# Patient Record
Sex: Male | Born: 1995
Health system: Southern US, Community
[De-identification: ages and names within clinical notes are randomized; demographics above are authoritative.]

## PROBLEM LIST (undated history)

## (undated) DIAGNOSIS — J302 Other seasonal allergic rhinitis: Secondary | ICD-10-CM

## (undated) DIAGNOSIS — J45909 Unspecified asthma, uncomplicated: Secondary | ICD-10-CM

## (undated) DIAGNOSIS — Q678 Other congenital deformities of chest: Secondary | ICD-10-CM

## (undated) DIAGNOSIS — Z8661 Personal history of infections of the central nervous system: Secondary | ICD-10-CM

## (undated) DIAGNOSIS — Z9109 Other allergy status, other than to drugs and biological substances: Secondary | ICD-10-CM

## (undated) DIAGNOSIS — G4733 Obstructive sleep apnea (adult) (pediatric): Secondary | ICD-10-CM

## (undated) HISTORY — DX: Other congenital deformities of chest: Q67.8

## (undated) HISTORY — DX: Personal history of infections of the central nervous system: Z86.61

## (undated) HISTORY — DX: Other seasonal allergic rhinitis: J30.2

## (undated) HISTORY — DX: Other allergy status, other than to drugs and biological substances: Z91.09

## (undated) HISTORY — DX: Obstructive sleep apnea (adult) (pediatric): G47.33

## (undated) HISTORY — DX: Unspecified asthma, uncomplicated: J45.909

---
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
## 2016-01-04 ENCOUNTER — Ambulatory Visit: Payer: Managed Care, Other (non HMO) | Admitting: Psychology

## 2016-04-25 ENCOUNTER — Encounter: Payer: Managed Care, Other (non HMO) | Admitting: Family Medicine

## 2016-06-23 ENCOUNTER — Encounter: Payer: Self-pay | Admitting: Family Medicine

## 2016-06-23 ENCOUNTER — Ambulatory Visit (INDEPENDENT_AMBULATORY_CARE_PROVIDER_SITE_OTHER): Payer: Managed Care, Other (non HMO) | Admitting: Family Medicine

## 2016-06-23 VITALS — BP 102/74 | HR 87 | Temp 98.3°F | Ht 70.0 in | Wt 132.5 lb

## 2016-06-23 DIAGNOSIS — J4520 Mild intermittent asthma, uncomplicated: Secondary | ICD-10-CM

## 2016-06-23 DIAGNOSIS — Z0000 Encounter for general adult medical examination without abnormal findings: Secondary | ICD-10-CM

## 2016-06-23 DIAGNOSIS — F411 Generalized anxiety disorder: Secondary | ICD-10-CM

## 2016-06-23 MED ORDER — BUSPIRONE HCL 5 MG PO TABS: ORAL_TABLET | ORAL | 11 refills | Status: DC

## 2016-06-23 MED ORDER — ALBUTEROL SULFATE HFA 108 (90 BASE) MCG/ACT IN AERS: 2.0000 | INHALATION_SPRAY | Freq: Four times a day (QID) | RESPIRATORY_TRACT | 5 refills | Status: DC | PRN

## 2016-06-23 NOTE — Assessment & Plan Note (Signed)
Controlled with PRN albuterol. Refilled.

## 2016-06-23 NOTE — Progress Notes (Addendum)
BP 102/74   Pulse 87   Temp 98.3 F (36.8 C) (Oral)   Ht 5\' 10"  (1.778 m)   Wt 132 lb 8 oz (60.1 kg)   SpO2 97%   BMI 19.01 kg/m    CC: CPE Subjective:    Patient ID: [name] [name], male    DOB: August 17, 1995, 21 y.o.   MRN: [id number]  HPI: [name] [name] is a 21 y.o. male presenting on 06/23/2016 for Annual Exam (Pt states he is doing well.) and Medication Refill (albuterol)   Anxiety - buspar 5/10mg  daily. Takes melatonin 1mg  at night time to help sleep - this helps prevent vivid dreams on buspar.   OSA, mild intermittent asthma, allergic rhinitis - sees Dr Craige Cotta. Off singulair. Asthma largely controlled.   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 As, 1 B this past year.   Preventative: Eye doctor 2 wks ago. Dentist 1 wk ago.  Brushes and floss.  Seat belt use discussed. Sunscreen use discussed. No changing moles on skin. Sees derm regularly - on accutane. Labs [place] with accutane (currently on month 4). I asked for copy if available.   Goes to gym at school.  Enjoys reading, video games.   Denies smoking, MJ or other recreational drugs. No alcohol.  Currently dating long term GF >2yr monogamous relationship. Discussed condom use and birth control.   No prior STD testing, declines today.   Relevant past medical, surgical, family and social history reviewed and updated as indicated. Interim medical history since our last visit reviewed. Allergies and medications reviewed and updated. Outpatient Medications Prior to Visit  Medication Sig Dispense Refill  . cetirizine (ZYRTEC) 10 MG tablet Take 10 mg by mouth daily as needed.     . Melatonin 1 MG CAPS Take 1 capsule (1 mg total) by mouth at bedtime.    Marland Kitchen albuterol (PROVENTIL HFA;VENTOLIN HFA) 108 (90 BASE) MCG/ACT inhaler Inhale 2 puffs into the lungs every 6 (six) hours as needed for wheezing. 1 Inhaler 5  . busPIRone (BUSPAR) 5 MG tablet Take one in morning and two at night  90 tablet 8  . fluticasone (FLONASE) 50 MCG/ACT nasal spray Place 2 sprays into both nostrils daily. 16 g 3  . Fluticasone-Salmeterol (ADVAIR) 100-50 MCG/DOSE AEPB Inhale 1 puff into the lungs 2 (two) times daily. 1 each 3  . montelukast (SINGULAIR) 10 MG tablet Take 1 tablet (10 mg total) by mouth at bedtime. 30 tablet 11   No facility-administered medications prior to visit.      Per HPI unless specifically indicated in ROS section below Review of Systems  Constitutional: Negative for activity change, appetite change, fatigue and fever.  HENT: Negative for congestion and sore throat.   Eyes: Negative for visual disturbance.  Respiratory: Negative for cough, chest tightness, shortness of breath and wheezing.   Cardiovascular: Negative for chest pain.  Gastrointestinal: Negative for abdominal pain.  Genitourinary: Negative for difficulty urinating.  Musculoskeletal: Negative for arthralgias.  Skin: Negative for rash.  Allergic/Immunologic: Negative for immunocompromised state.  Neurological: Negative for dizziness and headaches.  Hematological: Negative for adenopathy.  Psychiatric/Behavioral: Negative for dysphoric mood. The patient is not nervous/anxious.        Objective:    BP 102/74   Pulse 87   Temp 98.3 F (36.8 C) (Oral)   Ht 5\' 10"  (1.778 m)   Wt 132 lb 8 oz (60.1 kg)   SpO2 97%   BMI 19.01 kg/m  Wt Readings from Last 3 Encounters:  06/23/16 132 lb 8 oz (60.1 kg)  08/24/15 128 lb 8 oz (58.3 kg) (11 %, Z= -1.21)*  07/24/15 129 lb (58.5 kg) (12 %, Z= -1.16)*   * Growth percentiles are based on CDC 2-20 Years data.    Physical Exam  Constitutional: He is oriented to person, place, and time. He appears well-developed and well-nourished. No distress.  HENT:  Head: Normocephalic and atraumatic.  Right Ear: Hearing, tympanic membrane, external ear and ear canal normal.  Left Ear: Hearing, tympanic membrane, external ear and ear canal normal.  Nose: Nose normal.    Mouth/Throat: Uvula is midline, oropharynx is clear and moist and mucous membranes are normal. No oropharyngeal exudate, posterior oropharyngeal edema or posterior oropharyngeal erythema.  Eyes: Conjunctivae and EOM are normal. Pupils are equal, round, and reactive to light. No scleral icterus.  Neck: Normal range of motion. Neck supple. No thyromegaly present.  Cardiovascular: Normal rate, regular rhythm, normal heart sounds and intact distal pulses.   No murmur heard. Pulses:      Radial pulses are 2+ on the right side, and 2+ on the left side.  Pulmonary/Chest: Effort normal and breath sounds normal. No respiratory distress. He has no wheezes. He has no rales.  Abdominal: Soft. Bowel sounds are normal. He exhibits no distension and no mass. There is no tenderness. There is no rebound and no guarding.  Musculoskeletal: Normal range of motion. He exhibits no edema.  Lymphadenopathy:    He has no cervical adenopathy.  Neurological: He is alert and oriented to person, place, and time.  CN grossly intact, station and gait intact  Skin: Skin is warm and dry. No rash noted.  Psychiatric: He has a normal mood and affect. His behavior is normal. Judgment and thought content normal.  Nursing note and vitals reviewed.     Assessment & Plan:   Problem List Items Addressed This Visit    GAD (generalized anxiety disorder)    Stable period on buspar 5mg /10mg  daily. Melatonin 1mg  helps prevent vivid dreams as side effect of buspar.       Health maintenance examination - Primary    Preventative protocols reviewed and updated unless pt declined. Discussed healthy diet and lifestyle.       Mild intermittent asthma    Controlled with PRN albuterol. Refilled.       Relevant Medications   albuterol (PROVENTIL HFA;VENTOLIN HFA) 108 (90 Base) MCG/ACT inhaler       Follow up plan: Return in about 1 year (around 06/23/2017) for annual exam, prior fasting for blood work.  Eustaquio BoydenJavier Makenley Shimp, MD

## 2016-06-23 NOTE — Patient Instructions (Signed)
You are doing well today. Return as needed or in 1 year for next physical.  Health Maintenance, Male A healthy lifestyle and preventive care is important for your health and wellness. Ask your health care provider about what schedule of regular examinations is right for you. What should I know about weight and diet? Eat a Healthy Diet  Eat plenty of vegetables, fruits, whole grains, low-fat dairy products, and lean protein.  Do not eat a lot of foods high in solid fats, added sugars, or salt.  Maintain a Healthy Weight Regular exercise can help you achieve or maintain a healthy weight. You should:  Do at least 150 minutes of exercise each week. The exercise should increase your heart rate and make you sweat (moderate-intensity exercise).  Do strength-training exercises at least twice a week.  Watch Your Levels of Cholesterol and Blood Lipids  Have your blood tested for lipids and cholesterol every 5 years starting at 20 years of age. If you are at high risk for heart disease, you should start having your blood tested when you are 20 years old. You may need to have your cholesterol levels checked more often if: ? Your lipid or cholesterol levels are high. ? You are older than 20 years of age. ? You are at high risk for heart disease.  What should I know about cancer screening? Many types of cancers can be detected early and may often be prevented. Lung Cancer  You should be screened every year for lung cancer if: ? You are a current smoker who has smoked for at least 30 years. ? You are a former smoker who has quit within the past 15 years.  Talk to your health care provider about your screening options, when you should start screening, and how often you should be screened.  Colorectal Cancer  Routine colorectal cancer screening usually begins at 20 years of age and should be repeated every 5-10 years until you are 20 years old. You may need to be screened more often if early  forms of precancerous polyps or small growths are found. Your health care provider may recommend screening at an earlier age if you have risk factors for colon cancer.  Your health care provider may recommend using home test kits to check for hidden blood in the stool.  A small camera at the end of a tube can be used to examine your colon (sigmoidoscopy or colonoscopy). This checks for the earliest forms of colorectal cancer.  Prostate and Testicular Cancer  Depending on your age and overall health, your health care provider may do certain tests to screen for prostate and testicular cancer.  Talk to your health care provider about any symptoms or concerns you have about testicular or prostate cancer.  Skin Cancer  Check your skin from head to toe regularly.  Tell your health care provider about any new moles or changes in moles, especially if: ? There is a change in a mole's size, shape, or color. ? You have a mole that is larger than a pencil eraser.  Always use sunscreen. Apply sunscreen liberally and repeat throughout the day.  Protect yourself by wearing long sleeves, pants, a wide-brimmed hat, and sunglasses when outside.  What should I know about heart disease, diabetes, and high blood pressure?  If you are 18-39 years of age, have your blood pressure checked every 3-5 years. If you are 40 years of age or older, have your blood pressure checked every year. You should have   your blood pressure measured twice-once when you are at a hospital or clinic, and once when you are not at a hospital or clinic. Record the average of the two measurements. To check your blood pressure when you are not at a hospital or clinic, you can use: ? An automated blood pressure machine at a pharmacy. ? A home blood pressure monitor.  Talk to your health care provider about your target blood pressure.  If you are between 45-79 years old, ask your health care provider if you should take aspirin to prevent  heart disease.  Have regular diabetes screenings by checking your fasting blood sugar level. ? If you are at a normal weight and have a low risk for diabetes, have this test once every three years after the age of 45. ? If you are overweight and have a high risk for diabetes, consider being tested at a younger age or more often.  A one-time screening for abdominal aortic aneurysm (AAA) by ultrasound is recommended for men aged 65-75 years who are current or former smokers. What should I know about preventing infection? Hepatitis B If you have a higher risk for hepatitis B, you should be screened for this virus. Talk with your health care provider to find out if you are at risk for hepatitis B infection. Hepatitis C Blood testing is recommended for:  Everyone born from 1945 through 1965.  Anyone with known risk factors for hepatitis C.  Sexually Transmitted Diseases (STDs)  You should be screened each year for STDs including gonorrhea and chlamydia if: ? You are sexually active and are younger than 20 years of age. ? You are older than 20 years of age and your health care provider tells you that you are at risk for this type of infection. ? Your sexual activity has changed since you were last screened and you are at an increased risk for chlamydia or gonorrhea. Ask your health care provider if you are at risk.  Talk with your health care provider about whether you are at high risk of being infected with HIV. Your health care provider may recommend a prescription medicine to help prevent HIV infection.  What else can I do?  Schedule regular health, dental, and eye exams.  Stay current with your vaccines (immunizations).  Do not use any tobacco products, such as cigarettes, chewing tobacco, and e-cigarettes. If you need help quitting, ask your health care provider.  Limit alcohol intake to no more than 2 drinks per day. One drink equals 12 ounces of beer, 5 ounces of wine, or 1 ounces  of hard liquor.  Do not use street drugs.  Do not share needles.  Ask your health care provider for help if you need support or information about quitting drugs.  Tell your health care provider if you often feel depressed.  Tell your health care provider if you have ever been abused or do not feel safe at home. This information is not intended to replace advice given to you by your health care provider. Make sure you discuss any questions you have with your health care provider. Document Released: 06/28/2007 Document Revised: 08/29/2015 Document Reviewed: 10/03/2014 Elsevier Interactive Patient Education  2018 Elsevier Inc.  

## 2016-06-23 NOTE — Assessment & Plan Note (Signed)
Preventative protocols reviewed and updated unless pt declined. Discussed healthy diet and lifestyle.  

## 2016-06-23 NOTE — Assessment & Plan Note (Signed)
Stable period on buspar 5mg /10mg  daily. Melatonin 1mg  helps prevent vivid dreams as side effect of buspar.

## 2016-07-06 ENCOUNTER — Encounter: Payer: Self-pay | Admitting: Family Medicine

## 2016-07-06 DIAGNOSIS — L709 Acne, unspecified: Secondary | ICD-10-CM | POA: Insufficient documentation

## 2016-10-03 ENCOUNTER — Other Ambulatory Visit: Payer: Self-pay | Admitting: Family Medicine

## 2016-10-03 ENCOUNTER — Other Ambulatory Visit: Payer: Self-pay | Admitting: *Deleted

## 2016-10-03 MED ORDER — BUSPIRONE HCL 5 MG PO TABS: ORAL_TABLET | ORAL | 2 refills | Status: DC

## 2016-10-03 MED ORDER — BUSPIRONE HCL 5 MG PO TABS: ORAL_TABLET | ORAL | 11 refills | Status: DC

## 2016-10-03 NOTE — Telephone Encounter (Signed)
[facility] closed in [place]. [facility] Rx sent [facility]

## 2016-10-03 NOTE — Addendum Note (Signed)
Addended by: Desmond Dike on: 10/03/2016 10:45 AM   Modules accepted: Orders

## 2016-10-03 NOTE — Telephone Encounter (Signed)
Mother contacted office back and [facility] out of med. She has contacted [employer]. Sent as req

## 2017-06-26 ENCOUNTER — Ambulatory Visit: Payer: 59 | Admitting: Family Medicine

## 2017-06-26 ENCOUNTER — Encounter: Payer: Self-pay | Admitting: Family Medicine

## 2017-06-26 VITALS — BP 116/80 | HR 80 | Temp 98.3°F | Ht 70.0 in | Wt 140.2 lb

## 2017-06-26 DIAGNOSIS — F411 Generalized anxiety disorder: Secondary | ICD-10-CM | POA: Diagnosis not present

## 2017-06-26 MED ORDER — CITALOPRAM HYDROBROMIDE 10 MG PO TABS: 10.0000 mg | ORAL_TABLET | Freq: Every day | ORAL | 6 refills | Status: DC

## 2017-06-26 NOTE — Addendum Note (Signed)
Addended by: Eustaquio BoydenGUTIERREZ, Marwa Fuhrman on: 06/26/2017 04:24 PM   Modules accepted: Orders

## 2017-06-26 NOTE — Patient Instructions (Addendum)
Let's taper off of buspar - 5mg  at night for 2 weeks then stop.  Start celexa 10mg  daily - take 1/2 tablet for the first week to ensure tolerated well.  Continue healthy stress relieving strategies.  Update me after 1 month through MyChart, return in 2 months for follow up.

## 2017-06-26 NOTE — Assessment & Plan Note (Addendum)
Increasing trouble tolerating buspar, not helping manage his anxiety, now noticing depressive symptoms related to trouble anxiety causes. Will taper off buspar over 2 wks, start celexa 10mg  daily (first week take 1/2 tab daily). Reviewed common side effects of medication including slight increased suicidality risk. Update via MyChart with effect in 1 month, RTC 2 mo f/u visit.  Continue healthy stress relieving strategies.

## 2017-06-26 NOTE — Progress Notes (Signed)
BP 116/80 (BP Location: Left Arm, Patient Position: Sitting, Cuff Size: Normal)   Pulse 80   Temp 98.3 F (36.8 C) (Oral)   Ht 5\' 10"  (1.778 m)   Wt 140 lb 4 oz (63.6 kg)   SpO2 98%   BMI 20.12 kg/m    CC: discuss anxiety Subjective:    Patient ID: [name] [name], male    DOB: 16-Jul-1995, 22 y.o.   MRN: [id number]  HPI: [name] [name] is a 22 y.o. male presenting on 06/26/2017 for Anxiety   GAD with anxiety attacks - has been taking buspar 5/10mg  daily for a few years. 10mg  bid dose caused dizziness. Current dose was initially effective, but recently noticing more trouble - dizziness to 5mg  dose even, avoids driving when dizzy (needs to drive to classes). Melatonin was helping prevent vivid dreams as side effect to buspar, but melatonin is causing more grogginess in the mornings - so he's stopped this as well. Previously saw counselor - was helpful but not interested in returning at this time.  Started going to gym. Deep breathing and mantra help him manage anxiety.   Noticing an increase in depressed mood with apathy - related to anxiety. No anhedonia. No HI. Some fleeting SI without plan. Appetite ok. Sleep trouble due to buspar. Energy levels ok.   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  Wants to go to [employer] grad school to study PhD in [employer] when he finishes college.   Alcohol - none.   Relevant past medical, surgical, family and social history reviewed and updated as indicated. Interim medical history since our last visit reviewed. Allergies and medications reviewed and updated. Outpatient Medications Prior to Visit  Medication Sig Dispense Refill  . albuterol (PROVENTIL HFA;VENTOLIN HFA) 108 (90 Base) MCG/ACT inhaler Inhale 2 puffs into the lungs every 6 (six) hours as needed for wheezing. 1 Inhaler 5  . busPIRone (BUSPAR) 5 MG tablet TAKE 1 TABLET BY MOUTH IN THE MORNING AND TAKE 2 AT BEDTIME 90 tablet 3  . cetirizine (ZYRTEC) 10 MG  tablet Take 10 mg by mouth daily as needed.     . ISOtretinoin (ACCUTANE) 40 MG capsule Take 40 mg by mouth 2 (two) times daily.    . Melatonin 1 MG CAPS Take 1 capsule (1 mg total) by mouth at bedtime.     No facility-administered medications prior to visit.      Per HPI unless specifically indicated in ROS section below Review of Systems     Objective:    BP 116/80 (BP Location: Left Arm, Patient Position: Sitting, Cuff Size: Normal)   Pulse 80   Temp 98.3 F (36.8 C) (Oral)   Ht 5\' 10"  (1.778 m)   Wt 140 lb 4 oz (63.6 kg)   SpO2 98%   BMI 20.12 kg/m   Wt Readings from Last 3 Encounters:  06/26/17 140 lb 4 oz (63.6 kg)  06/23/16 132 lb 8 oz (60.1 kg)  08/24/15 128 lb 8 oz (58.3 kg) (11 %, Z= -1.21)*   * Growth percentiles are based on CDC (Boys, 2-20 Years) data.    Physical Exam  Constitutional: He appears well-developed and well-nourished. No distress.  Psychiatric: His speech is normal and behavior is normal. Judgment and thought content normal. His mood appears anxious (mild).  Nursing note and vitals reviewed.  Depression screen PHQ 2/9 06/26/2017  Decreased Interest 2  Down, Depressed, Hopeless 2  PHQ - 2 Score 4  Altered sleeping  3  Tired, decreased energy 3  Change in appetite 0  Feeling bad or failure about yourself  1  Trouble concentrating 2  Moving slowly or fidgety/restless 0  Suicidal thoughts 1  PHQ-9 Score 14    GAD 7 : Generalized Anxiety Score 06/26/2017 06/26/2015  Nervous, Anxious, on Edge 3 3  Control/stop worrying 2 3  Worry too much - different things 2 2  Trouble relaxing 2 3  Restless 3 1  Easily annoyed or irritable 0 2  Afraid - awful might happen 0 3  Total GAD 7 Score 12 17      Assessment & Plan:   Problem List Items Addressed This Visit    GAD (generalized anxiety disorder) - Primary    Increasing trouble tolerating buspar, not helping manage his anxiety, now noticing depressive symptoms related to trouble anxiety causes.  Will taper off buspar over 2 wks, start celexa 10mg  daily (first week take 1/2 tab daily). Reviewed common side effects of medication including slight increased suicidality risk. Update via MyChart with effect in 1 month, RTC 2 mo f/u visit.  Continue healthy stress relieving strategies.      Relevant Medications   citalopram (CELEXA) 10 MG tablet       Meds ordered this encounter  Medications  . citalopram (CELEXA) 10 MG tablet    Sig: Take 1 tablet (10 mg total) by mouth daily.    Dispense:  30 tablet    Refill:  6   No orders of the defined types were placed in this encounter.   Follow up plan: Return if symptoms worsen or fail to improve.  Eustaquio BoydenJavier Chrisotpher Rivero, MD

## 2017-07-28 IMAGING — DX DG LUMBAR SPINE COMPLETE 4+V
5 series · 5 of 5 positions shown · non-contrast
Comparison: Lumbar spine series of August 23, 2013

CLINICAL DATA: Low back pain for several months; history of mild
scoliosis

EXAM:
LUMBAR SPINE - COMPLETE 4+ VIEW

[l-spine ap]
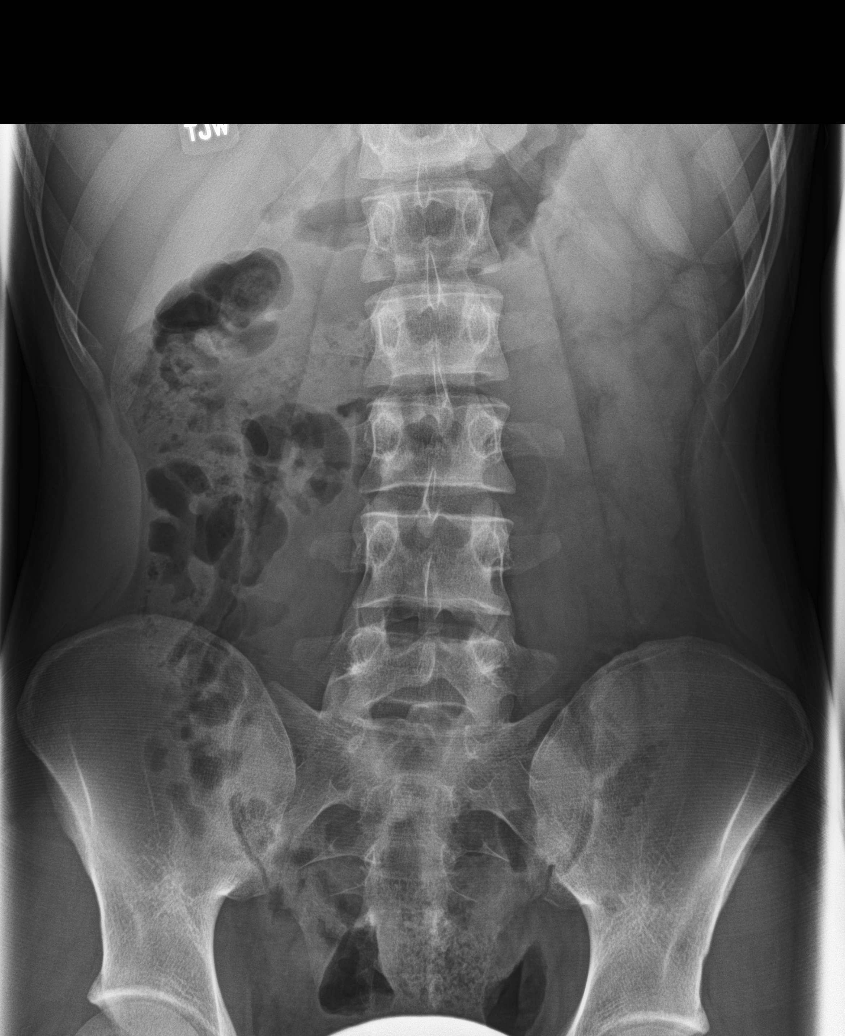

[l-spine obl (1 of 2)]
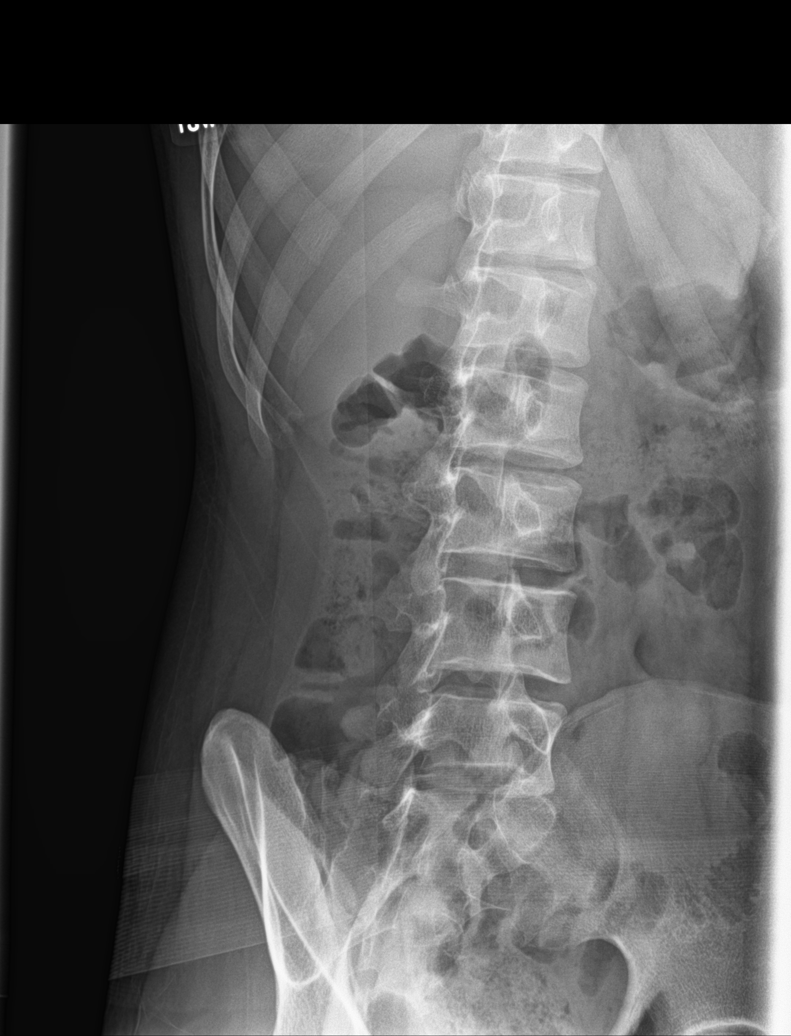

[l-spine obl (2 of 2)]
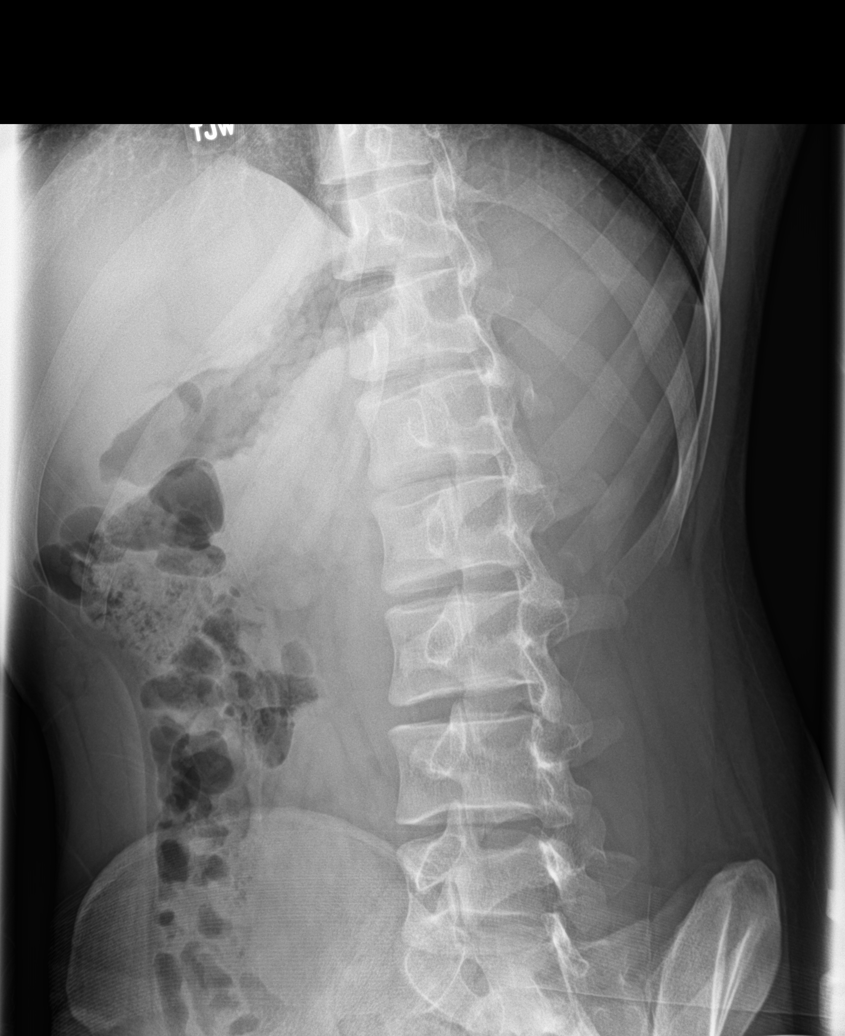

[l-spine lat]
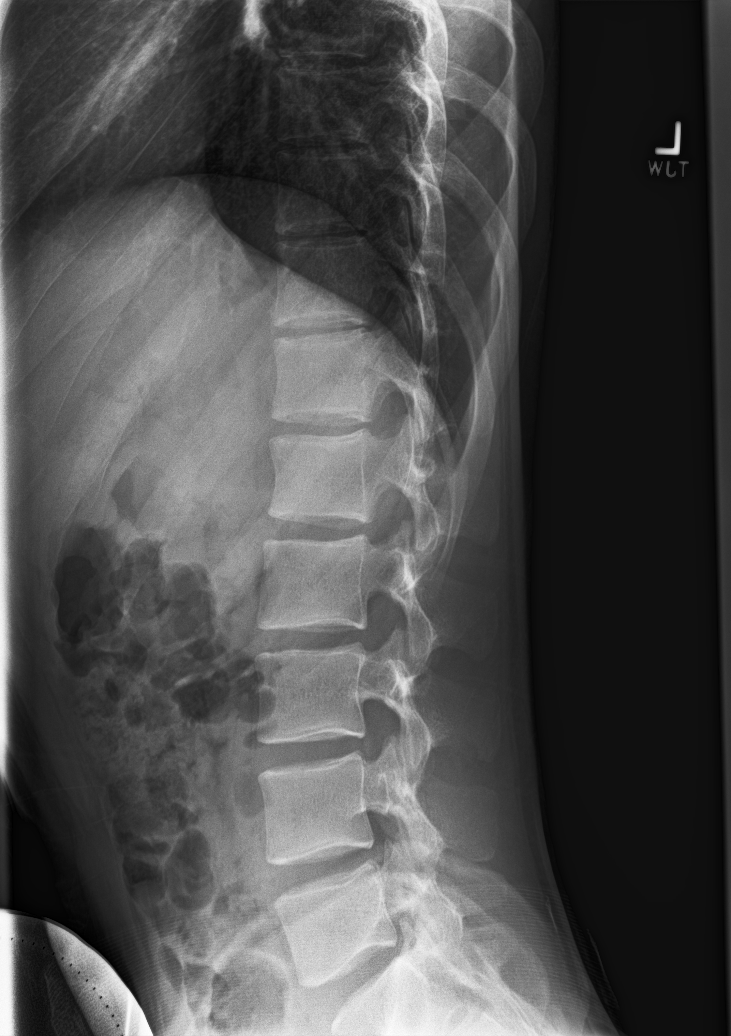

[l-spine l5/s1]
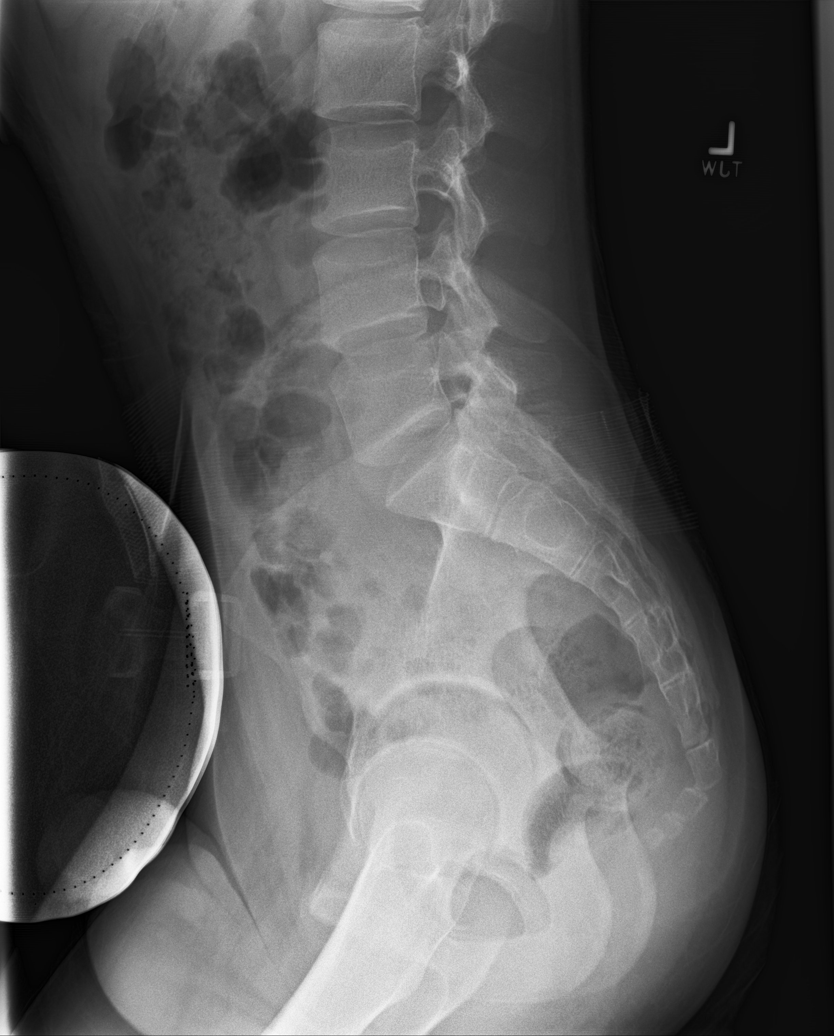

[5 of 5 positions shown; findings below may reference images not displayed]

FINDINGS: There is mild levocurvature of the lumbar spine centered at L3 which
is more conspicuous than in the past. The pedicles and transverse
processes are intact. There are no pars defects. The vertebral
bodies are preserved in height. The disc space heights are well
maintained. The observed portions of the sacrum are normal.
IMPRESSION: There is no acute bony abnormality nor significant disc space
narrowing. There is mild levocurvature centered at L3 which is more
conspicuous than that seen on the previous study.

## 2017-08-03 ENCOUNTER — Encounter: Payer: Self-pay | Admitting: Family Medicine

## 2018-02-26 ENCOUNTER — Other Ambulatory Visit: Payer: Self-pay | Admitting: Family Medicine

## 2018-02-26 NOTE — Telephone Encounter (Signed)
Please call patient and schedule follow-up on medication. Send back for refill when after appointment is scheduled.

## 2018-02-26 NOTE — Telephone Encounter (Signed)
Appointment 3/6 pt aware

## 2018-03-08 ENCOUNTER — Other Ambulatory Visit: Payer: Self-pay | Admitting: Family Medicine

## 2018-03-09 ENCOUNTER — Encounter: Payer: Self-pay | Admitting: Family Medicine

## 2018-03-10 MED ORDER — CITALOPRAM HYDROBROMIDE 10 MG PO TABS: ORAL_TABLET | ORAL | 3 refills | Status: DC

## 2018-03-19 ENCOUNTER — Ambulatory Visit: Payer: Self-pay | Admitting: Family Medicine

## 2018-08-20 ENCOUNTER — Encounter: Payer: Self-pay | Admitting: Family Medicine

## 2018-08-20 ENCOUNTER — Ambulatory Visit (INDEPENDENT_AMBULATORY_CARE_PROVIDER_SITE_OTHER): Payer: 59 | Admitting: Family Medicine

## 2018-08-20 ENCOUNTER — Other Ambulatory Visit: Payer: Self-pay

## 2018-08-20 DIAGNOSIS — F411 Generalized anxiety disorder: Secondary | ICD-10-CM | POA: Diagnosis not present

## 2018-08-20 MED ORDER — CITALOPRAM HYDROBROMIDE 20 MG PO TABS: 20.0000 mg | ORAL_TABLET | Freq: Every day | ORAL | 3 refills | Status: DC

## 2018-08-20 NOTE — Patient Instructions (Signed)
You are doing well today Trial celexa (citalopram) 20mg  daily. If side effects on higher dose then cut in half and let me know for next refill to return to 10mg  dose.

## 2018-08-20 NOTE — Assessment & Plan Note (Signed)
Overall doing well but interested in higher celexa dose - will send in 20mg  dose to trial. He will decrease to 10mg  dose if side effects develop. Pt agrees with plan.

## 2018-08-20 NOTE — Progress Notes (Signed)
This visit was conducted in person.  BP 112/70 (BP Location: Left Arm, Patient Position: Sitting, Cuff Size: Normal)   Pulse 74   Temp 98.2 F (36.8 C) (Temporal)   Ht 5\' 10"  (1.778 m)   Wt 153 lb 7 oz (69.6 kg)   SpO2 98%   BMI 22.02 kg/m    CC: anxiety f/u visit Subjective:    Patient ID: [name] [name], male    DOB: [birth year], 23 y.o.   MRN: [id number]  HPI: [name] [name] is a 23 y.o. male presenting on 08/20/2018 for Anxiety (Here for f/u.)   GAD - last year we cross tapered off buspar onto celexa 10mg  daily due to increased buspar dizzy side fx. Has previously seen counselor. Fleeting SI - distracts himself.   [employer] last year. Planning to start [employer] grad school for [employer].   Has been in [place] for last few months.       Relevant past medical, surgical, family and social history reviewed and updated as indicated. Interim medical history since our last visit reviewed. Allergies and medications reviewed and updated. Outpatient Medications Prior to Visit  Medication Sig Dispense Refill  . albuterol (PROVENTIL HFA;VENTOLIN HFA) 108 (90 Base) MCG/ACT inhaler Inhale 2 puffs into the lungs every 6 (six) hours as needed for wheezing. 1 Inhaler 5  . cetirizine (ZYRTEC) 10 MG tablet Take 10 mg by mouth daily as needed.     . citalopram (CELEXA) 10 MG tablet TAKE 1 TABLET(10 MG) BY MOUTH DAILY 30 tablet 3   No facility-administered medications prior to visit.      Per HPI unless specifically indicated in ROS section below Review of Systems Objective:    BP 112/70 (BP Location: Left Arm, Patient Position: Sitting, Cuff Size: Normal)   Pulse 74   Temp 98.2 F (36.8 C) (Temporal)   Ht 5\' 10"  (1.778 m)   Wt 153 lb 7 oz (69.6 kg)   SpO2 98%   BMI 22.02 kg/m   Wt Readings from Last 3 Encounters:  08/20/18 153 lb 7 oz (69.6 kg)  06/26/17 140 lb 4 oz (63.6 kg)  06/23/16 132 lb 8 oz (60.1 kg)    Physical Exam Vitals signs and nursing note reviewed.   Constitutional:      General: He is not in acute distress.    Appearance: Normal appearance. He is not ill-appearing.  HENT:     Mouth/Throat:     Mouth: Mucous membranes are moist.  Cardiovascular:     Rate and Rhythm: Normal rate and regular rhythm.     Pulses: Normal pulses.     Heart sounds: Normal heart sounds. No murmur.  Pulmonary:     Effort: Pulmonary effort is normal. No respiratory distress.     Breath sounds: Normal breath sounds. No wheezing, rhonchi or rales.  Neurological:     Mental Status: He is alert.  Psychiatric:        Mood and Affect: Mood normal.        Behavior: Behavior normal.        GAD 7 : Generalized Anxiety Score 08/20/2018 06/26/2017 06/26/2015  Nervous, Anxious, on Edge 2 3 3   Control/stop worrying 1 2 3   Worry too much - different things 1 2 2   Trouble relaxing 0 2 3  Restless 0 3 1  Easily annoyed or irritable 2 0 2  Afraid - awful might happen 1 0 3  Total GAD 7 Score 7 12 17    Depression  screen [facility] 2/9 08/20/2018 06/26/2017  Decreased Interest 1 2  Down, Depressed, Hopeless 1 2  PHQ - 2 Score 2 4  Altered sleeping 3 3  Tired, decreased energy 1 3  Change in appetite 0 0  Feeling bad or failure about yourself  0 1  Trouble concentrating 2 2  Moving slowly or fidgety/restless 0 0  Suicidal thoughts 1 1  PHQ-9 Score 9 14   Assessment & Plan:   Problem List Items Addressed This Visit    GAD (generalized anxiety disorder)    Overall doing well but interested in higher celexa dose - will send in 20mg  dose to trial. He will decrease to 10mg  dose if side effects develop. Pt agrees with plan.       Relevant Medications   citalopram (CELEXA) 20 MG tablet       Meds ordered this encounter  Medications  . citalopram (CELEXA) 20 MG tablet    Sig: Take 1 tablet (20 mg total) by mouth daily.    Dispense:  90 tablet    Refill:  3   No orders of the defined types were placed in this encounter.   Follow up plan: Return in about 1 year  (around 08/20/2019) for annual exam, prior fasting for blood work.  [name] BoydenJavier Genesis Novosad, MD

## 2018-10-28 ENCOUNTER — Ambulatory Visit (INDEPENDENT_AMBULATORY_CARE_PROVIDER_SITE_OTHER): Payer: 59

## 2018-10-28 DIAGNOSIS — Z23 Encounter for immunization: Secondary | ICD-10-CM | POA: Diagnosis not present

## 2019-02-08 ENCOUNTER — Encounter: Payer: Self-pay | Admitting: Family Medicine

## 2019-02-08 DIAGNOSIS — F411 Generalized anxiety disorder: Secondary | ICD-10-CM

## 2019-02-09 ENCOUNTER — Ambulatory Visit: Payer: 59 | Admitting: Family Medicine

## 2019-02-21 ENCOUNTER — Ambulatory Visit: Payer: 59 | Admitting: Psychology

## 2019-02-21 ENCOUNTER — Ambulatory Visit (INDEPENDENT_AMBULATORY_CARE_PROVIDER_SITE_OTHER): Payer: 59 | Admitting: Psychology

## 2019-02-21 DIAGNOSIS — F411 Generalized anxiety disorder: Secondary | ICD-10-CM

## 2019-02-21 DIAGNOSIS — F330 Major depressive disorder, recurrent, mild: Secondary | ICD-10-CM

## 2019-03-07 ENCOUNTER — Ambulatory Visit (INDEPENDENT_AMBULATORY_CARE_PROVIDER_SITE_OTHER): Payer: 59 | Admitting: Psychology

## 2019-03-07 DIAGNOSIS — F411 Generalized anxiety disorder: Secondary | ICD-10-CM

## 2019-03-07 DIAGNOSIS — F330 Major depressive disorder, recurrent, mild: Secondary | ICD-10-CM

## 2019-03-19 ENCOUNTER — Ambulatory Visit: Payer: 59 | Attending: Internal Medicine

## 2019-03-19 DIAGNOSIS — Z23 Encounter for immunization: Secondary | ICD-10-CM | POA: Insufficient documentation

## 2019-03-19 NOTE — Progress Notes (Signed)
   Covid-19 Vaccination Clinic  Name:  [name] [name]    MRN: [id number] DOB: August 26, 1995  03/19/2019  [name] [name] was observed post Covid-19 immunization for 15 minutes without incident. He was provided with Vaccine Information Sheet and instruction to access the V-Safe system.   [name] [name] was instructed to call 911 with any severe reactions post vaccine: Marland Kitchen Difficulty breathing  . Swelling of face and throat  . A fast heartbeat  . A bad rash all over body  . Dizziness and weakness   Immunizations Administered    Name Date Dose VIS Date Route   Pfizer COVID-19 Vaccine 03/19/2019 11:47 AM 0.3 mL 12/24/2018 Intramuscular   Manufacturer: [employer], [employer]   Lot: [id number]   NDC: [address]-6

## 2019-03-22 ENCOUNTER — Ambulatory Visit (INDEPENDENT_AMBULATORY_CARE_PROVIDER_SITE_OTHER): Payer: 59 | Admitting: Psychology

## 2019-03-22 DIAGNOSIS — F411 Generalized anxiety disorder: Secondary | ICD-10-CM | POA: Diagnosis not present

## 2019-03-22 DIAGNOSIS — F330 Major depressive disorder, recurrent, mild: Secondary | ICD-10-CM

## 2019-03-30 ENCOUNTER — Ambulatory Visit (INDEPENDENT_AMBULATORY_CARE_PROVIDER_SITE_OTHER): Payer: 59 | Admitting: Psychology

## 2019-03-30 DIAGNOSIS — F411 Generalized anxiety disorder: Secondary | ICD-10-CM

## 2019-03-30 DIAGNOSIS — F330 Major depressive disorder, recurrent, mild: Secondary | ICD-10-CM | POA: Diagnosis not present

## 2019-04-09 ENCOUNTER — Ambulatory Visit: Payer: 59 | Attending: Internal Medicine

## 2019-04-09 DIAGNOSIS — Z23 Encounter for immunization: Secondary | ICD-10-CM

## 2019-04-09 NOTE — Progress Notes (Signed)
   Covid-19 Vaccination Clinic  Name:  [name] [name]    MRN: [id number] DOB: [birth year]  04/09/2019  Mr. [name] [name] was observed post Covid-19 immunization for 15 minutes without incident. He was provided with Vaccine Information Sheet and instruction to access the V-Safe system.   Mr. [name] [name] was instructed to call 911 with any severe reactions post vaccine: Marland Kitchen Difficulty breathing  . Swelling of face and throat  . A fast heartbeat  . A bad rash all over body  . Dizziness and weakness   Immunizations Administered    Name Date Dose VIS Date Route   Pfizer COVID-19 Vaccine 04/09/2019  9:04 AM 0.3 mL 12/24/2018 Intramuscular   Manufacturer: [employer], [employer]   Lot: [id number]   NDC: [address]-6

## 2019-04-12 ENCOUNTER — Ambulatory Visit (INDEPENDENT_AMBULATORY_CARE_PROVIDER_SITE_OTHER): Payer: 59 | Admitting: Psychology

## 2019-04-12 DIAGNOSIS — F331 Major depressive disorder, recurrent, moderate: Secondary | ICD-10-CM

## 2019-04-12 DIAGNOSIS — F411 Generalized anxiety disorder: Secondary | ICD-10-CM | POA: Diagnosis not present

## 2019-04-16 ENCOUNTER — Encounter: Payer: Self-pay | Admitting: Family Medicine

## 2019-04-18 NOTE — Telephone Encounter (Addendum)
Spoke with pt scheduling GAD f/u OV on 04/20/19 at 9:15.  Denies any life threatening effects of citalopram.

## 2019-04-19 ENCOUNTER — Ambulatory Visit: Payer: Self-pay

## 2019-04-20 ENCOUNTER — Encounter: Payer: Self-pay | Admitting: Family Medicine

## 2019-04-20 ENCOUNTER — Other Ambulatory Visit: Payer: Self-pay

## 2019-04-20 ENCOUNTER — Ambulatory Visit: Payer: 59 | Admitting: Family Medicine

## 2019-04-20 DIAGNOSIS — F411 Generalized anxiety disorder: Secondary | ICD-10-CM | POA: Diagnosis not present

## 2019-04-20 MED ORDER — ALBUTEROL SULFATE HFA 108 (90 BASE) MCG/ACT IN AERS: 2.0000 | INHALATION_SPRAY | Freq: Four times a day (QID) | RESPIRATORY_TRACT | 3 refills | Status: DC | PRN

## 2019-04-20 MED ORDER — FLUOXETINE HCL 40 MG PO CAPS: 40.0000 mg | ORAL_CAPSULE | Freq: Every day | ORAL | 6 refills | Status: DC

## 2019-04-20 NOTE — Assessment & Plan Note (Signed)
Anxiety > depression with some OCD tendencies.  Finds celexa 20mg  causing delayed ejaculation.  Discussed wellbutrin is only antidepressant without sexual side effects, however may not be best choice with anxiety or for OCD tendencies.  Will trial prozac 40mg  in place of celexa. Update with effect, may communicate through mychart.  F/u open ended, RTC 3 mo if not doing well or sooner as needed.

## 2019-04-20 NOTE — Progress Notes (Signed)
This visit was conducted in person.  BP 116/80 (BP Location: Left Arm, Patient Position: Sitting, Cuff Size: Normal)   Pulse 86   Temp 97.7 F (36.5 C) (Temporal)   Ht 5\' 10"  (1.778 m)   Wt 160 lb 9 oz (72.8 kg)   SpO2 98%   BMI 23.04 kg/m    CC: mood f/u Subjective:    Patient ID: , male    DOB: December 28, 1995, 24 y.o.   MRN: [id number]  HPI: [name] [name] is a 24 y.o. male presenting on 04/20/2019 for Anxiety (Here for f/u and discuss meds. )   See prior note for details.  GAD - 2019 we cross tapered off buspar onto celexa 20mg  daily due to buspar side effect of dizziness. Restarted counseling last month - with [facility]. Notes anxiety >> depression. Exploring OCD tendencies with counselor - this may be a manifestation of his anxiety. 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 Was on celexa 20mg  daily. Cannot afford psychiatrist. Notes delayed ejaculation with celexa.   Going to grad school for [employer].  Took leave of absence - doesn't like virtual school. Planning to go back in person Fall 2021. Looking into jobs using undergrad degree [employer] and [employer]). 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   Some ongoing SI - distracts himself - grounding exercises. Finds therapy has helped with this. No concern for acting out on these thoughts     Relevant past medical, surgical, family and social history reviewed and updated as indicated. Interim medical history since our last visit reviewed. Allergies and medications reviewed and updated. Outpatient Medications Prior to Visit  Medication Sig Dispense Refill  . cetirizine (ZYRTEC) 10 MG tablet Take 10 mg by mouth daily as needed.     [employer] albuterol (PROVENTIL HFA;VENTOLIN HFA) 108 (90 Base) MCG/ACT inhaler Inhale 2 puffs into the lungs every 6 (six) hours as needed for  wheezing. 1 Inhaler 5  . citalopram (CELEXA) 20 MG tablet Take 1 tablet (20 mg total) by mouth daily. 90 tablet 3   No facility-administered medications prior to visit.     Per HPI unless specifically indicated in ROS section below Review of Systems Objective:    BP 116/80 (BP Location: Left Arm, Patient Position: Sitting, Cuff Size: Normal)   Pulse 86   Temp 97.7 F (36.5 C) (Temporal)   Ht 5\' 10"  (1.778 m)   Wt 160 lb 9 oz (72.8 kg)   SpO2 98%   BMI 23.04 kg/m   Wt Readings from Last 3 Encounters:  04/20/19 160 lb 9 oz (72.8 kg)  08/20/18 153 lb 7 oz (69.6 kg)  06/26/17 140 lb 4 oz (63.6 kg)    Physical Exam Vitals and nursing note reviewed.  Constitutional:      Appearance: Normal appearance. He is not ill-appearing.  Neck:     Thyroid: No thyromegaly or thyroid tenderness.  Cardiovascular:     Rate and Rhythm: Normal rate and regular rhythm.     Pulses: Normal pulses.     Heart sounds: Normal heart sounds. No murmur.  Pulmonary:     Effort: Pulmonary effort is normal. No respiratory distress.     Breath sounds: Normal breath sounds. No wheezing, rhonchi or rales.  Neurological:     Mental Status: He is alert.  Psychiatric:  Mood and Affect: Mood normal.        Behavior: Behavior normal.       Depression screen [facility] 2/9 04/20/2019 08/20/2018 06/26/2017  Decreased Interest 1 1 2   Down, Depressed, Hopeless 1 1 2   PHQ - 2 Score 2 2 4   Altered sleeping 3 3 3   Tired, decreased energy 3 1 3   Change in appetite 0 0 0  Feeling bad or failure about yourself  2 0 1  Trouble concentrating 2 2 2   Moving slowly or fidgety/restless 0 0 0  Suicidal thoughts 2 1 1   PHQ-9 Score 14 9 14     GAD 7 : Generalized Anxiety Score 04/20/2019 08/20/2018 06/26/2017 06/26/2015  Nervous, Anxious, on Edge 3 2 3 3   Control/stop worrying 2 1 2 3   Worry too much - different things 2 1 2 2   Trouble relaxing 2 0 2 3  Restless 1 0 3 1  Easily annoyed or irritable 1 2 0 2  Afraid - awful  might happen 3 1 0 3  Total GAD 7 Score 14 7 12 17    Assessment & Plan:  This visit occurred during the SARS-CoV-2 public health emergency.  Safety protocols were in place, including screening questions prior to the visit, additional usage of staff PPE, and extensive cleaning of exam room while observing appropriate contact time as indicated for disinfecting solutions.   Problem List Items Addressed This Visit    GAD (generalized anxiety disorder)    Anxiety > depression with some OCD tendencies.  Finds celexa 20mg  causing delayed ejaculation.  Discussed wellbutrin is only antidepressant without sexual side effects, however may not be best choice with anxiety or for OCD tendencies.  Will trial prozac 40mg  in place of celexa. Update with effect, may communicate through mychart.  F/u open ended, RTC 3 mo if not doing well or sooner as needed.       Relevant Medications   FLUoxetine (PROZAC) 40 MG capsule       Meds ordered this encounter  Medications  . albuterol (VENTOLIN HFA) 108 (90 Base) MCG/ACT inhaler    Sig: Inhale 2 puffs into the lungs every 6 (six) hours as needed for wheezing.    Dispense:  18 g    Refill:  3  . FLUoxetine (PROZAC) 40 MG capsule    Sig: Take 1 capsule (40 mg total) by mouth daily.    Dispense:  30 capsule    Refill:  6   No orders of the defined types were placed in this encounter.   Patient Instructions  Let's switch from celexa to prozac 40mg  daily - may stop celexa one day and start prozac the next. Update me over mychart with how you're doing, let us know if ongoing trouble with sexual dysfunction to trial different antidepressant.    Follow up plan: Return if symptoms worsen or fail to improve.  Ria Bush, MD

## 2019-04-20 NOTE — Patient Instructions (Signed)
Let's switch from celexa to prozac 40mg  daily - may stop celexa one day and start prozac the next. Update me over mychart with how you're doing, let know if ongoing trouble with sexual dysfunction to trial different antidepressant.

## 2019-05-04 ENCOUNTER — Ambulatory Visit (INDEPENDENT_AMBULATORY_CARE_PROVIDER_SITE_OTHER): Payer: 59 | Admitting: Psychology

## 2019-05-04 DIAGNOSIS — F331 Major depressive disorder, recurrent, moderate: Secondary | ICD-10-CM

## 2019-05-04 DIAGNOSIS — F411 Generalized anxiety disorder: Secondary | ICD-10-CM

## 2019-05-06 ENCOUNTER — Encounter: Payer: Self-pay | Admitting: Family Medicine

## 2019-05-06 DIAGNOSIS — F411 Generalized anxiety disorder: Secondary | ICD-10-CM

## 2019-05-08 MED ORDER — SERTRALINE HCL 50 MG PO TABS: 50.0000 mg | ORAL_TABLET | Freq: Every day | ORAL | 3 refills | Status: DC

## 2019-05-31 ENCOUNTER — Ambulatory Visit: Payer: 59 | Admitting: Psychology

## 2019-08-24 ENCOUNTER — Encounter: Payer: 59 | Admitting: Family Medicine

## 2019-10-07 ENCOUNTER — Other Ambulatory Visit: Payer: Self-pay

## 2019-10-07 ENCOUNTER — Encounter: Payer: Self-pay | Admitting: Family Medicine

## 2019-10-07 ENCOUNTER — Ambulatory Visit (INDEPENDENT_AMBULATORY_CARE_PROVIDER_SITE_OTHER): Payer: BC Managed Care – PPO | Admitting: Family Medicine

## 2019-10-07 VITALS — BP 116/82 | HR 61 | Temp 98.2°F | Ht 70.0 in | Wt 160.1 lb

## 2019-10-07 DIAGNOSIS — Z0000 Encounter for general adult medical examination without abnormal findings: Secondary | ICD-10-CM | POA: Diagnosis not present

## 2019-10-07 DIAGNOSIS — Z23 Encounter for immunization: Secondary | ICD-10-CM

## 2019-10-07 DIAGNOSIS — Z113 Encounter for screening for infections with a predominantly sexual mode of transmission: Secondary | ICD-10-CM | POA: Diagnosis not present

## 2019-10-07 DIAGNOSIS — R451 Restlessness and agitation: Secondary | ICD-10-CM

## 2019-10-07 DIAGNOSIS — Z13220 Encounter for screening for lipoid disorders: Secondary | ICD-10-CM

## 2019-10-07 DIAGNOSIS — G4733 Obstructive sleep apnea (adult) (pediatric): Secondary | ICD-10-CM

## 2019-10-07 DIAGNOSIS — F411 Generalized anxiety disorder: Secondary | ICD-10-CM

## 2019-10-07 MED ORDER — BUPROPION HCL ER (SR) 100 MG PO TB12: 100.0000 mg | ORAL_TABLET | Freq: Every day | ORAL | 3 refills | Status: DC

## 2019-10-07 NOTE — Patient Instructions (Addendum)
Flu shot today  Tdap today.  Labs today  Look into online counseling.  Let's try wellbutrin 100mg  SR once daily for mood, attention, energy.  Update me through mychart with how you're doing. Return as needed or in 1 year for next physical.   Health Maintenance, Male Adopting a healthy lifestyle and getting preventive care are important in promoting health and wellness. Ask your health care provider about:  The right schedule for you to have regular tests and exams.  Things you can do on your own to prevent diseases and keep yourself healthy. What should I know about diet, weight, and exercise? Eat a healthy diet   Eat a diet that includes plenty of vegetables, fruits, low-fat dairy products, and lean protein.  Do not eat a lot of foods that are high in solid fats, added sugars, or sodium. Maintain a healthy weight Body mass index (BMI) is a measurement that can be used to identify possible weight problems. It estimates body fat based on height and weight. Your health care provider can help determine your BMI and help you achieve or maintain a healthy weight. Get regular exercise Get regular exercise. This is one of the most important things you can do for your health. Most adults should:  Exercise for at least 150 minutes each week. The exercise should increase your heart rate and make you sweat (moderate-intensity exercise).  Do strengthening exercises at least twice a week. This is in addition to the moderate-intensity exercise.  Spend less time sitting. Even light physical activity can be beneficial. Watch cholesterol and blood lipids Have your blood tested for lipids and cholesterol at 23 years of age, then have this test every 5 years. You may need to have your cholesterol levels checked more often if:  Your lipid or cholesterol levels are high.  You are older than 23 years of age.  You are at high risk for heart disease. What should I know about cancer screening? Many  types of cancers can be detected early and may often be prevented. Depending on your health history and family history, you may need to have cancer screening at various ages. This may include screening for:  Colorectal cancer.  Prostate cancer.  Skin cancer.  Lung cancer. What should I know about heart disease, diabetes, and high blood pressure? Blood pressure and heart disease  High blood pressure causes heart disease and increases the risk of stroke. This is more likely to develop in people who have high blood pressure readings, are of African descent, or are overweight.  Talk with your health care provider about your target blood pressure readings.  Have your blood pressure checked: ? Every 3-5 years if you are 66-26 years of age. ? Every year if you are 79 years old or older.  If you are between the ages of 34 and 2 and are a current or former smoker, ask your health care provider if you should have a one-time screening for abdominal aortic aneurysm (AAA). Diabetes Have regular diabetes screenings. This checks your fasting blood sugar level. Have the screening done:  Once every three years after age 70 if you are at a normal weight and have a low risk for diabetes.  More often and at a younger age if you are overweight or have a high risk for diabetes. What should I know about preventing infection? Hepatitis B If you have a higher risk for hepatitis B, you should be screened for this virus. Talk with your health care  provider to find out if you are at risk for hepatitis B infection. Hepatitis C Blood testing is recommended for:  Everyone born from [birth year] through 1964.  Anyone with known risk factors for hepatitis C. Sexually transmitted infections (STIs)  You should be screened each year for STIs, including gonorrhea and chlamydia, if: ? You are sexually active and are younger than 23 years of age. ? You are older than 23 years of age and your health care provider tells you  that you are at risk for this type of infection. ? Your sexual activity has changed since you were last screened, and you are at increased risk for chlamydia or gonorrhea. Ask your health care provider if you are at risk.  Ask your health care provider about whether you are at high risk for HIV. Your health care provider may recommend a prescription medicine to help prevent HIV infection. If you choose to take medicine to prevent HIV, you should first get tested for HIV. You should then be tested every 3 months for as long as you are taking the medicine. Follow these instructions at home: Lifestyle  Do not use any products that contain nicotine or tobacco, such as cigarettes, e-cigarettes, and chewing tobacco. If you need help quitting, ask your health care provider.  Do not use street drugs.  Do not share needles.  Ask your health care provider for help if you need support or information about quitting drugs. Alcohol use  Do not drink alcohol if your health care provider tells you not to drink.  If you drink alcohol: ? Limit how much you have to 0-2 drinks a day. ? Be aware of how much alcohol is in your drink. In the [place] one drink equals one 12 oz bottle of beer (355 mL), one 5 oz glass of wine (148 mL), or one 1 oz glass of hard liquor (44 mL). General instructions  Schedule regular health, dental, and eye exams.  Stay current with your vaccines.  Tell your health care provider if: ? You often feel depressed. ? You have ever been abused or do not feel safe at home. Summary  Adopting a healthy lifestyle and getting preventive care are important in promoting health and wellness.  Follow your health care provider's instructions about healthy diet, exercising, and getting tested or screened for diseases.  Follow your health care provider's instructions on monitoring your cholesterol and blood pressure. This information is not intended to replace advice given to you by your  health care provider. Make sure you discuss any questions you have with your health care provider. Document Revised: 12/23/2017 Document Reviewed: 12/23/2017 Elsevier Patient Education  2020 [employer].

## 2019-10-07 NOTE — Assessment & Plan Note (Signed)
Preventative protocols reviewed and updated unless pt declined. Discussed healthy diet and lifestyle.  

## 2019-10-07 NOTE — Progress Notes (Signed)
This visit was conducted in person.  BP 116/82 (BP Location: Left Arm, Patient Position: Sitting, Cuff Size: Normal)   Pulse 61   Temp 98.2 F (36.8 C) (Temporal)   Ht 5\' 10"  (1.778 m)   Wt 160 lb 1 oz (72.6 kg)   SpO2 98%   BMI 22.97 kg/m    CC: CPE Subjective:    Patient ID: [name] [name], male    DOB: May 02, 1995, 24 y.o.   MRN: [id number]  HPI: [name] [name] is a 24 y.o. male presenting on 10/07/2019 for Annual Exam   GAD/depression - was on celexa 20mg  daily, last visit we transitioned to prozac 40mg  daily but caused ongoing side effects so we transitioned to zoloft 50mg  daily - some apathy with this. Dizziness on buspar. Avoiding wellbutrin due to anxiety/OCD tendencies. He self tapered off all antidepressants and overall feels stable but notes ongoing difficulty managing stress. Some mental fogginess. Anxiety > depression. Doesn't think could afford psychiatry. No SI or self harm - grounding exercises help. Has seen counselor - currently not seeing anyone but may look into online counseling. Finds inner restlessness - has bouncing ball he uses at home. Overall attention is good. + fmhx ADD.  [facility]07/2019 04/20/2019 08/20/2018 06/26/2017  Decreased Interest 0 1 1 2   Down, Depressed, Hopeless 0 1 1 2   PHQ - 2 Score 0 2 2 4   Altered sleeping 2 3 3 3   Tired, decreased energy 2 3 1 3   Change in appetite 1 0 0 0  Feeling bad or failure about yourself  2 2 0 1  Trouble concentrating 0 2 2 2   Moving slowly or fidgety/restless 1 0 0 0  Suicidal thoughts 2 2 1 1   PHQ-9 Score 10 14 9 14     GAD 7 : Generalized Anxiety Score 10/07/2019 04/20/2019 08/20/2018 06/26/2017  Nervous, Anxious, on Edge 2 3 2 3   Control/stop worrying 1 2 1 2   Worry too much - different things 2 2 1 2   Trouble relaxing 2 2 0 2  Restless 1 1 0 3  Easily annoyed or irritable 1 1 2  0  Afraid - awful might happen 2 3 1  0  Total GAD 7 Score 11 14 7 12     Pt endorses persistent pattern of  inattention and/or hyperactivity/impulsivity that can interfere with daily functioning. Symptoms present in 2 or more settings. Symptoms present before 23 years of age? unsure Inattention (6+, 6 months): Careless mistakes? no Difficulty sustaining attention? yes Doesn't listen when spoken to directly? no Lacks follow through with instructions or difficulty completing tasks? yes Difficulty organizing tasks/activities? yes Avoiding tasks that require sustained attention? some Easily losing things needed for tasks? no Easily distracted by external stimuli? yes Forgetful in daily activities? yes Hyperactive/impulsive (6+, 6 months): Fidgeting, tapping hands/feet? yes Leaves seat when expected to remain in seat? yes Feeling restless, or running around when not expected? yes Unable to engage in leisurely activities quietly? yes Always on the go, "driven by a motor"? yes Excessive talking? some Blurting out answer, responds to other's questions? no Difficulty waiting in line or waiting turn? no Interrupting or intruding on others? no    Preventative: Flu shot yearly COVID vaccine - Pfizer 03/2019 x2 Tdap 2009, again today Non smoker Alcohol - 1 drink a few times a week rec drugs - none Currently sexually active with 1 partner - monogamous with GF. Partner uses OCP Seat belt use discussed. Sunscreen use discussed. No changing moles  on skin.  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 Interim medical history since our last visit reviewed. Allergies and medications reviewed and updated. Outpatient Medications Prior to Visit  Medication Sig Dispense Refill  . albuterol (VENTOLIN HFA) 108 (90 Base) MCG/ACT inhaler Inhale 2 puffs into the lungs every 6 (six) hours as needed  for wheezing. 18 g 3  . cetirizine (ZYRTEC) 10 MG tablet Take 10 mg by mouth daily as needed.     [employer] FLUoxetine (PROZAC) 40 MG capsule Take 1 capsule (40 mg total) by mouth daily. (Patient not taking: Reported on 10/07/2019) 30 capsule 6  . sertraline (ZOLOFT) 50 MG tablet Take 1 tablet (50 mg total) by mouth daily. (Patient not taking: Reported on 10/07/2019) 30 tablet 3   No facility-administered medications prior to visit.     Per HPI unless specifically indicated in ROS section below Review of Systems  Constitutional: Negative for activity change, appetite change, chills, fatigue, fever and unexpected weight change.  HENT: Negative for hearing loss.   Eyes: Negative for visual disturbance.  Respiratory: Negative for cough, chest tightness, shortness of breath and wheezing.   Cardiovascular: Negative for chest pain, palpitations and leg swelling.  Gastrointestinal: Negative for abdominal distention, abdominal pain, blood in stool, constipation, diarrhea, nausea and vomiting.  Genitourinary: Negative for difficulty urinating and hematuria.  Musculoskeletal: Negative for arthralgias, myalgias and neck pain.  Skin: Negative for rash.  Neurological: Negative for dizziness, seizures, syncope and headaches.  Hematological: Negative for adenopathy. Does not bruise/bleed easily.  Psychiatric/Behavioral: Negative for behavioral problems, dysphoric mood and suicidal ideas. The patient is nervous/anxious.    Objective:  BP 116/82 (BP Location: Left Arm, Patient Position: Sitting, Cuff Size: Normal)   Pulse 61   Temp 98.2 F (36.8 C) (Temporal)   Ht 5\' 10"  (1.778 m)   Wt 160 lb 1 oz (72.6 kg)   SpO2 98%   BMI 22.97 kg/m   Wt Readings from Last 3 Encounters:  10/07/19 160 lb 1 oz (72.6 kg)  04/20/19 160 lb 9 oz (72.8 kg)  08/20/18 153 lb 7 oz (69.6 kg)      Physical Exam Vitals and nursing note reviewed.  Constitutional:      General: He is not in acute distress.    Appearance: Normal  appearance. He is well-developed. He is not ill-appearing.  HENT:     Head: Normocephalic and atraumatic.     Right Ear: Hearing, tympanic membrane, ear canal and external ear normal.     Left Ear: Hearing, tympanic membrane, ear canal and external ear normal.  Eyes:     General: No scleral icterus.    Extraocular Movements: Extraocular movements intact.     Conjunctiva/sclera: Conjunctivae normal.     Pupils: Pupils are equal, round, and reactive to light.  Neck:     Thyroid: No thyroid mass or thyromegaly.  Cardiovascular:     Rate and Rhythm: Normal rate and regular rhythm.     Pulses: Normal pulses.          Radial pulses are 2+ on the right side and 2+ on the left side.     Heart sounds: Normal heart sounds. No murmur heard.   Pulmonary:     Effort: Pulmonary effort is normal. No respiratory distress.  Breath sounds: Normal breath sounds. No wheezing, rhonchi or rales.  Abdominal:     General: Abdomen is flat. Bowel sounds are normal. There is no distension.     Palpations: Abdomen is soft. There is no mass.     Tenderness: There is no abdominal tenderness. There is no guarding or rebound.     Hernia: No hernia is present.  Musculoskeletal:        General: Normal range of motion.     Cervical back: Normal range of motion and neck supple.     Right lower leg: No edema.     Left lower leg: No edema.  Lymphadenopathy:     Cervical: No cervical adenopathy.  Skin:    General: Skin is warm and dry.     Findings: No rash.  Neurological:     General: No focal deficit present.     Mental Status: He is alert and oriented to person, place, and time.     Comments: CN grossly intact, station and gait intact  Psychiatric:        Mood and Affect: Mood normal.        Behavior: Behavior normal.        Thought Content: Thought content normal.        Judgment: Judgment normal.       Results for orders placed or performed in visit on 10/07/19  Comprehensive metabolic panel    Result Value Ref Range   Glucose, Bld 86 65 - 99 mg/dL   BUN 10 7 - 25 mg/dL   Creat 1.96 2.22 - 9.79 mg/dL   BUN/Creatinine Ratio NOT APPLICABLE 6 - 22 (calc)   Sodium 140 135 - 146 mmol/L   Potassium 4.2 3.5 - 5.3 mmol/L   Chloride 104 98 - 110 mmol/L   CO2 28 20 - 32 mmol/L   Calcium 9.8 8.6 - 10.3 mg/dL   Total Protein 7.0 6.1 - 8.1 g/dL   Albumin 4.6 3.6 - 5.1 g/dL   Globulin 2.4 1.9 - 3.7 g/dL (calc)   AG Ratio 1.9 1.0 - 2.5 (calc)   Total Bilirubin 2.1 (H) 0.2 - 1.2 mg/dL   Alkaline phosphatase (APISO) 56 36 - 130 U/L   AST 14 10 - 40 U/L   ALT 12 9 - 46 U/L  TSH  Result Value Ref Range   TSH 1.75 0.40 - 4.50 mIU/L  CBC with Differential/Platelet  Result Value Ref Range   WBC 7.5 3.8 - 10.8 Thousand/uL   RBC 5.00 4.20 - 5.80 Million/uL   Hemoglobin 16.0 13.2 - 17.1 g/dL   HCT 89.2 38 - 50 %   MCV 93.0 80.0 - 100.0 fL   MCH 32.0 27.0 - 33.0 pg   MCHC 34.4 32.0 - 36.0 g/dL   RDW 11.9 41.7 - 40.8 %   Platelets 317 140 - 400 Thousand/uL   MPV 9.5 7.5 - 12.5 fL   Neutro Abs 4,568 1,500 - 7,800 cells/uL   Lymphs Abs 2,175 850 - 3,900 cells/uL   Absolute Monocytes 510 200 - 950 cells/uL   Eosinophils Absolute 218 15 - 500 cells/uL   Basophils Absolute 30 0 - 200 cells/uL   Neutrophils Relative % 60.9 %   Total Lymphocyte 29.0 %   Monocytes Relative 6.8 %   Eosinophils Relative 2.9 %   Basophils Relative 0.4 %  Lipid panel  Result Value Ref Range   Cholesterol 120 <200 mg/dL   HDL 53 > OR = 40 mg/dL  Triglycerides 49 <150 mg/dL   LDL Cholesterol (Calc) 53 mg/dL (calc)   Total CHOL/HDL Ratio 2.3 <5.0 (calc)   Non-HDL Cholesterol (Calc) 67 <638 mg/dL (calc)    Assessment & Plan:  This visit occurred during the SARS-CoV-2 public health emergency.  Safety protocols were in place, including screening questions prior to the visit, additional usage of staff PPE, and extensive cleaning of exam room while observing appropriate contact time as indicated for disinfecting  solutions.   Problem List Items Addressed This Visit    Restlessness    Some intermittent restlessness throughout his life, explored OCD tendencies with prior counselor. Today describes inattention as well, borderline positive screen for adult ADHD. Discussed this possibility. Interested in trial of wellbutrin to help focus - will start 100mg  SR once daily dosing, take in am. No h/o seizures. Update with effect. Discussed option of formal ADHD testing through psychology.       Obstructive sleep apnea   Relevant Orders   Comprehensive metabolic panel (Completed)   TSH (Completed)   CBC with Differential/Platelet (Completed)   Health maintenance examination - Primary    Preventative protocols reviewed and updated unless pt declined. Discussed healthy diet and lifestyle.       GAD (generalized anxiety disorder)    Longstanding h/o this, hasn't responded fully to SSRI trials. Self tapered off antidepressant this year. Planning to see . See below.       Relevant Medications   buPROPion (WELLBUTRIN SR) 100 MG 12 hr tablet    Other Visit Diagnoses    Need for influenza vaccination       Relevant Orders   Flu Vaccine QUAD 36+ mos IM (Completed)   Screen for STD (sexually transmitted disease)       Relevant Orders   HIV Antibody (routine testing w rflx)   RPR   Lipid screening       Relevant Orders   Lipid panel (Completed)   Need for Tdap vaccination       Relevant Orders   Tdap vaccine greater than or equal to 7yo IM (Completed)       Meds ordered this encounter  Medications  . buPROPion (WELLBUTRIN SR) 100 MG 12 hr tablet    Sig: Take 1 tablet (100 mg total) by mouth daily.    Dispense:  30 tablet    Refill:  3   Orders Placed This Encounter  Procedures  . Flu Vaccine QUAD 36+ mos IM  . Tdap vaccine greater than or equal to 7yo IM  . Comprehensive metabolic panel  . TSH  . CBC with Differential/Platelet  . HIV Antibody (routine testing w rflx)  .  Lipid panel  . RPR    Patient instructions: Flu shot today  Tdap today.  Labs today  Look into online counseling.  Let's try wellbutrin 100mg  SR once daily for mood, attention, energy.  Update me through mychart with how you're doing. Return as needed or in 1 year for next physical.   Follow up plan: Return in about 1 year (around 10/06/2020), or if symptoms worsen or fail to improve, for annual exam, prior fasting for blood work.  , MD

## 2019-10-08 ENCOUNTER — Encounter: Payer: Self-pay | Admitting: Family Medicine

## 2019-10-08 DIAGNOSIS — R451 Restlessness and agitation: Secondary | ICD-10-CM | POA: Insufficient documentation

## 2019-10-08 NOTE — Assessment & Plan Note (Signed)
Some intermittent restlessness throughout his life, explored OCD tendencies with prior counselor. Today describes inattention as well, borderline positive screen for adult ADHD. Discussed this possibility. Interested in trial of wellbutrin to help focus - will start 100mg  SR once daily dosing, take in am. No h/o seizures. Update with effect. Discussed option of formal ADHD testing through psychology.

## 2019-10-08 NOTE — Assessment & Plan Note (Signed)
Longstanding h/o this, hasn't responded fully to SSRI trials. Self tapered off antidepressant this year. Planning to see Personnel officer. See below.

## 2019-10-10 LAB — CBC WITH DIFFERENTIAL/PLATELET
Absolute Monocytes: 510 cells/uL (ref 200–950)
Basophils Absolute: 30 cells/uL (ref 0–200)
Basophils Relative: 0.4 %
Eosinophils Absolute: 218 cells/uL (ref 15–500)
Eosinophils Relative: 2.9 %
HCT: 46.5 % (ref 38.5–50.0)
Hemoglobin: 16 g/dL (ref 13.2–17.1)
Lymphs Abs: 2175 cells/uL (ref 850–3900)
MCH: 32 pg (ref 27.0–33.0)
MCHC: 34.4 g/dL (ref 32.0–36.0)
MCV: 93 fL (ref 80.0–100.0)
MPV: 9.5 fL (ref 7.5–12.5)
Monocytes Relative: 6.8 %
Neutro Abs: 4568 cells/uL (ref 1500–7800)
Neutrophils Relative %: 60.9 %
Platelets: 317 10*3/uL (ref 140–400)
RBC: 5 10*6/uL (ref 4.20–5.80)
RDW: 12.7 % (ref 11.0–15.0)
Total Lymphocyte: 29 %
WBC: 7.5 10*3/uL (ref 3.8–10.8)

## 2019-10-10 LAB — LIPID PANEL
Cholesterol: 120 mg/dL (ref <200)
HDL: 53 mg/dL (ref >=40)
LDL Cholesterol (Calc): 53 mg/dL (calc)
Non-HDL Cholesterol (Calc): 67 mg/dL (calc) (ref <130)
Total CHOL/HDL Ratio: 2.3 (calc) (ref <5.0)
Triglycerides: 49 mg/dL (ref <150)

## 2019-10-10 LAB — COMPREHENSIVE METABOLIC PANEL
AG Ratio: 1.9 (calc) (ref 1.0–2.5)
ALT: 12 U/L (ref 9–46)
AST: 14 U/L (ref 10–40)
Albumin: 4.6 g/dL (ref 3.6–5.1)
Alkaline phosphatase (APISO): 56 U/L (ref 36–130)
BUN: 10 mg/dL (ref 7–25)
CO2: 28 mmol/L (ref 20–32)
Calcium: 9.8 mg/dL (ref 8.6–10.3)
Chloride: 104 mmol/L (ref 98–110)
Creat: 0.76 mg/dL (ref 0.60–1.35)
Globulin: 2.4 g/dL (calc) (ref 1.9–3.7)
Glucose, Bld: 86 mg/dL (ref 65–99)
Potassium: 4.2 mmol/L (ref 3.5–5.3)
Sodium: 140 mmol/L (ref 135–146)
Total Bilirubin: 2.1 mg/dL — ABNORMAL HIGH (ref 0.2–1.2)
Total Protein: 7 g/dL (ref 6.1–8.1)

## 2019-10-10 LAB — RPR: RPR Ser Ql: NONREACTIVE

## 2019-10-10 LAB — HIV ANTIBODY (ROUTINE TESTING W REFLEX): HIV 1&2 Ab, 4th Generation: NONREACTIVE

## 2019-10-10 LAB — TSH: TSH: 1.75 mIU/L (ref 0.40–4.50)

## 2019-10-18 ENCOUNTER — Encounter: Payer: Self-pay | Admitting: Family Medicine

## 2019-10-18 DIAGNOSIS — R451 Restlessness and agitation: Secondary | ICD-10-CM

## 2019-10-18 DIAGNOSIS — F411 Generalized anxiety disorder: Secondary | ICD-10-CM

## 2019-10-18 DIAGNOSIS — Z1339 Encounter for screening examination for other mental health and behavioral disorders: Secondary | ICD-10-CM

## 2019-10-24 NOTE — Addendum Note (Signed)
Addended by: Eustaquio Boyden on: 10/24/2019 08:42 PM   Modules accepted: Orders

## 2019-10-27 NOTE — Telephone Encounter (Deleted)
I do not see a referral in Epic.

## 2020-02-07 ENCOUNTER — Encounter: Payer: Self-pay | Admitting: Family Medicine

## 2020-02-07 ENCOUNTER — Telehealth: Payer: BC Managed Care – PPO | Admitting: Family Medicine

## 2020-02-07 DIAGNOSIS — F39 Unspecified mood [affective] disorder: Secondary | ICD-10-CM | POA: Insufficient documentation

## 2020-02-07 DIAGNOSIS — F322 Major depressive disorder, single episode, severe without psychotic features: Secondary | ICD-10-CM

## 2020-02-07 DIAGNOSIS — F5105 Insomnia due to other mental disorder: Secondary | ICD-10-CM

## 2020-02-07 DIAGNOSIS — F411 Generalized anxiety disorder: Secondary | ICD-10-CM | POA: Diagnosis not present

## 2020-02-07 DIAGNOSIS — F419 Anxiety disorder, unspecified: Secondary | ICD-10-CM

## 2020-02-07 DIAGNOSIS — G4700 Insomnia, unspecified: Secondary | ICD-10-CM | POA: Insufficient documentation

## 2020-02-07 MED ORDER — TRAZODONE HCL 50 MG PO TABS: 50.0000 mg | ORAL_TABLET | Freq: Every day | ORAL | 1 refills | Status: DC

## 2020-02-07 NOTE — Progress Notes (Signed)
Mr. [name] [name], [name] [name] are scheduled for a virtual visit with your provider today.    Just as we do with appointments in the office, we must obtain your consent to participate.  Your consent will be active for this visit and any virtual visit you may have with one of our providers in the next 365 days.    If you have a MyChart account, I can also send a copy of this consent to you electronically.  All virtual visits are billed to your insurance company just like a traditional visit in the office.  As this is a virtual visit, video technology does not allow for your provider to perform a traditional examination.  This may limit your provider's ability to fully assess your condition.  If your provider identifies any concerns that need to be evaluated in person or the need to arrange testing such as labs, EKG, etc, we will make arrangements to do so.    Although advances in technology are sophisticated, we cannot ensure that it will always work on either your end or our end.  If the connection with a video visit is poor, we may have to switch to a telephone visit.  With either a video or telephone visit, we are not always able to ensure that we have a secure connection.   I need to obtain your verbal consent now.   Are you willing to proceed with your visit today?   [name] [name] has provided verbal consent on 02/07/2020 for a virtual visit (video or telephone).   [name] Finner, NP 02/07/2020  11:13 AM   Date:  02/07/2020   ID:  [name] [name], DOB 01-25-1995, MRN [id number]  Patient Location: Home Provider Location: Home Office   Participants: Patient and Provider for Visit and Wrap up  Method of visit: Video  Location of Patient: Home Location of Provider: Home Office Consent was obtain for visit over the video. Services rendered by provider: Visit was performed via video  A video enabled telemedicine application was used and I verified that I am speaking with the correct person using two  identifiers.  PCP:  [name] Boyden, MD   Chief Complaint:  Anxiety, depression and can not sleep  History of Present Illness:    [name] [name] is a 25 y.o. male with history as stated below. Presents video telehealth for an acute care visit due to increased insomnia, depression and anxiety.  Has a history of having these in the past.  Has been on several medications including Celexa, Prozac, Zoloft, BuSpar, Wellbutrin.  He does not do very well with some of these as they have the side effects and he has been transitioned and tapered off and on them for some time.  He denies having any active SI or self-harm at this time.  However he does report that over the weekend it came to ahead and he started having thoughts of wanting to harm himself which is what prompted him to seek visit.  He has practiced grounding exercises in the past.  Is seeing a counselor on a at but not actively being seen by regularly.  He is looking to get diagnosis of ADHD to see if there is some overlap there with some of his symptoms.  However his testing cannot be done till May.  He feels he is very restless at times.  Feels like a bouncing ball at times.  Is not able to sleep at this time.  Previously did not think that he can afford psychiatry  however in today's conversation he is willing to revisit the idea secondary to a new job.  The patient does not have symptoms concerning for COVID-19 infection (fever, chills, cough, or new shortness of breath).   Past Medical, Surgical, Social History, Allergies, and Medications have been Reviewed.  Past Medical History:  Diagnosis Date  . Asthma   . Deformity, chest wall, congenital   . Environmental allergies    mold,pine trees  . History of meningitis infancy  . OSA (obstructive sleep apnea)   . Seasonal allergies     Current Meds  Medication Sig  . traZODone (DESYREL) 50 MG tablet Take 1 tablet (50 mg total) by mouth at bedtime.     Allergies:   Peanut-containing  drug products and Wellbutrin [bupropion]   ROS See HPI for history of present illness.  Physical Exam Constitutional:      Appearance: Normal appearance.  HENT:     Head: Normocephalic.     Nose: Nose normal.  Eyes:     Conjunctiva/sclera: Conjunctivae normal.  Pulmonary:     Breath sounds: Normal breath sounds.  Neurological:     Mental Status: He is alert and oriented to person, place, and time.  Psychiatric:        Attention and Perception: Attention and perception normal.        Mood and Affect: Mood is depressed.        Behavior: Behavior is cooperative.     Comments: Good eye contact and communication               Depression screen [facility] [address] 02/07/2020 10/07/2019 04/20/2019 08/20/2018 06/26/2017  Decreased Interest 3 0 1 1 2   Down, Depressed, Hopeless 3 0 1 1 2   PHQ - 2 Score 6 0 2 2 4   Altered sleeping 3 2 3 3 3   Tired, decreased energy 3 2 3 1 3   Change in appetite 2 1 0 0 0  Feeling bad or failure about yourself  1 2 2  0 1  Trouble concentrating 3 0 2 2 2   Moving slowly or fidgety/restless 1 1 0 0 0  Suicidal thoughts 2 2 2 1 1   PHQ-9 Score 21 10 14 9 14   Difficult doing work/chores Extremely dIfficult - - - -   GAD 7 : Generalized Anxiety Score 02/07/2020 10/07/2019 04/20/2019 08/20/2018  Nervous, Anxious, on Edge 2 2 3 2   Control/stop worrying 1 1 2 1   Worry too much - different things 3 2 2 1   Trouble relaxing 3 2 2  0  Restless 2 1 1  0  Easily annoyed or irritable 1 1 1 2   Afraid - awful might happen 2 2 3 1   Total GAD 7 Score 14 11 14 7   Anxiety Difficulty Extremely difficult - - -      1. Depression, major, single episode, severe (HCC) -Increase level of depression does not feel that he has good control over it at this time.  Is willing to try trazodone at night.  With close follow-up with primary care. -I have suggested that psychiatry be revisited. -Encouraged to be continued pursuit of ADHD testing in case there is overlap -Verbal agreement to not do any  self harm and to reach out to your provider, friend and or emergency services. -Denies having any active SI or HI today on visit  - traZODone (DESYREL) 50 MG tablet; Take 1 tablet (50 mg total) by mouth at bedtime.  Dispense: 30 tablet; Refill: 1  2. GAD (generalized  anxiety disorder)  -Increase level of depression does not feel that he has good control over it at this time.  Is willing to try trazodone at night.  With close follow-up with primary care. -I have suggested that psychiatry be revisited. -Encouraged to be continued pursuit of ADHD testing in case there is overlap -Verbal agreement to not do any self harm and to reach out to your provider, friend and or emergency services -Denies having any active SI or HI on visit  - traZODone (DESYREL) 50 MG tablet; Take 1 tablet (50 mg total) by mouth at bedtime.  Dispense: 30 tablet; Refill: 1  3. Insomnia secondary to anxiety  -Increase level of depression does not feel that he has good control over it at this time.  Is willing to try trazodone at night.  With close follow-up with primary care. -I have suggested that psychiatry be revisited. -Encouraged to be continued pursuit of ADHD testing in case there is overlap -Verbal agreement to not do any self harm and to reach out to your provider, friend and or emergency services  - traZODone (DESYREL) 50 MG tablet; Take 1 tablet (50 mg total) by mouth at bedtime.  Dispense: 30 tablet; Refill: 1    Time:   Today, I have spent 25 minutes with the patient with telehealth technology discussing the above problems, reviewing the chart, previous notes, medications and orders.    Tests Ordered: No orders of the defined types were placed in this encounter.   Medication Changes: Meds ordered this encounter  Medications  . traZODone (DESYREL) 50 MG tablet    Sig: Take 1 tablet (50 mg total) by mouth at bedtime.    Dispense:  30 tablet    Refill:  1    Order Specific Question:   Supervising  Provider    Answer:   Kerri Perches [[id number]]     Disposition:  Follow up with primary Signed, [name] Finner, NP  02/07/2020 11:13 AM

## 2020-02-07 NOTE — Patient Instructions (Signed)
I appreciate the opportunity to provide you with care for your health and wellness.  Follow up: With primary care  Take trazodone as directed.  If you have any issues or concerns with this please reach out to your primary care provider.  I did send a message to your primary care provider to let him know that we have started this medication he might reach out to you or the office might reach out to you to set up an appointment to follow-up with you.  Please consider going to psychiatry prior to your ADHD testing since it is so far off to see if there is anything that can be done if this medication is not of benefit for you.  Please continue to practice social distancing to keep you, your family, and our community safe.  If you must go out, please wear a mask and practice good handwashing.

## 2020-02-09 ENCOUNTER — Telehealth: Payer: Self-pay | Admitting: Family Medicine

## 2020-02-09 NOTE — Telephone Encounter (Addendum)
Spoke with pt asking about the trazodone.  States it really helps with his sleep.  Also, pt agrees to psych referral.  Says that was discussed during telehealth visit and he saw info in MyChart.  He will let Dr. Reece Agar knows if a referral is needed.

## 2020-02-09 NOTE — Telephone Encounter (Signed)
Received message from telehealth provider after recent virtual visit for worsening depression/anxiety. Please call pt to see how he's doing with trazodone recently prescribed for sleep.  Awaiting ADHD testing for May.  Would offer psych referral for further eval in interim - he should have info in MyChart about local psychiatrists, but let me know if we need to place referral.

## 2020-02-20 ENCOUNTER — Encounter: Payer: Self-pay | Admitting: Family Medicine

## 2020-02-20 DIAGNOSIS — F419 Anxiety disorder, unspecified: Secondary | ICD-10-CM

## 2020-02-20 DIAGNOSIS — F411 Generalized anxiety disorder: Secondary | ICD-10-CM

## 2020-02-20 DIAGNOSIS — F322 Major depressive disorder, single episode, severe without psychotic features: Secondary | ICD-10-CM

## 2020-02-21 MED ORDER — TRAZODONE HCL 50 MG PO TABS: 50.0000 mg | ORAL_TABLET | Freq: Every day | ORAL | 3 refills | Status: DC

## 2020-04-27 ENCOUNTER — Encounter: Payer: Self-pay | Admitting: Family Medicine

## 2020-04-30 MED ORDER — ALBUTEROL SULFATE HFA 108 (90 BASE) MCG/ACT IN AERS: 2.0000 | INHALATION_SPRAY | Freq: Four times a day (QID) | RESPIRATORY_TRACT | 3 refills | Status: DC | PRN

## 2020-04-30 NOTE — Telephone Encounter (Signed)
E-scribed refill 

## 2020-05-07 ENCOUNTER — Ambulatory Visit (INDEPENDENT_AMBULATORY_CARE_PROVIDER_SITE_OTHER): Payer: BC Managed Care – PPO | Admitting: Psychology

## 2020-05-07 DIAGNOSIS — F339 Major depressive disorder, recurrent, unspecified: Secondary | ICD-10-CM

## 2020-05-07 DIAGNOSIS — F419 Anxiety disorder, unspecified: Secondary | ICD-10-CM | POA: Diagnosis not present

## 2020-05-21 ENCOUNTER — Ambulatory Visit: Payer: BC Managed Care – PPO | Admitting: Psychology

## 2020-05-24 ENCOUNTER — Ambulatory Visit (INDEPENDENT_AMBULATORY_CARE_PROVIDER_SITE_OTHER): Payer: BLUE CROSS/BLUE SHIELD | Admitting: Psychology

## 2020-05-24 DIAGNOSIS — F411 Generalized anxiety disorder: Secondary | ICD-10-CM

## 2020-05-24 DIAGNOSIS — F3181 Bipolar II disorder: Secondary | ICD-10-CM

## 2020-05-29 ENCOUNTER — Encounter: Payer: Self-pay | Admitting: Family Medicine

## 2020-05-29 DIAGNOSIS — F39 Unspecified mood [affective] disorder: Secondary | ICD-10-CM

## 2020-05-29 NOTE — Telephone Encounter (Signed)
Can we request records from Dr Altabet's office for recent ADHD evaluation?

## 2020-05-29 NOTE — Telephone Encounter (Signed)
Faxed med rec request for Continuity of Care.

## 2020-06-07 ENCOUNTER — Encounter: Payer: Self-pay | Admitting: Family Medicine

## 2020-06-12 NOTE — Addendum Note (Signed)
Addended by: Eustaquio Boyden on: 06/12/2020 11:24 AM   Modules accepted: Orders

## 2020-06-12 NOTE — Telephone Encounter (Signed)
Psychiatry referral placed for new mood disorder. He's completed psychological testing with Dr Reggy Eye. I have records available if needed.

## 2020-06-25 ENCOUNTER — Other Ambulatory Visit: Payer: Self-pay | Admitting: Family Medicine

## 2020-06-25 DIAGNOSIS — F322 Major depressive disorder, single episode, severe without psychotic features: Secondary | ICD-10-CM

## 2020-06-25 DIAGNOSIS — F5105 Insomnia due to other mental disorder: Secondary | ICD-10-CM

## 2020-06-25 DIAGNOSIS — F411 Generalized anxiety disorder: Secondary | ICD-10-CM

## 2020-06-26 NOTE — Telephone Encounter (Signed)
Sent MyChart message to pt to see how he is doing on the medication and much he is taking.

## 2020-06-27 NOTE — Telephone Encounter (Signed)
ERx 

## 2020-07-02 DIAGNOSIS — F319 Bipolar disorder, unspecified: Secondary | ICD-10-CM | POA: Diagnosis not present

## 2020-07-25 ENCOUNTER — Encounter: Payer: Self-pay | Admitting: Family Medicine

## 2020-07-25 ENCOUNTER — Other Ambulatory Visit: Payer: Self-pay

## 2020-07-25 ENCOUNTER — Ambulatory Visit (INDEPENDENT_AMBULATORY_CARE_PROVIDER_SITE_OTHER): Payer: BLUE CROSS/BLUE SHIELD | Admitting: Family Medicine

## 2020-07-25 VITALS — BP 122/74 | HR 75 | Temp 98.6°F | Ht 70.0 in | Wt 150.0 lb

## 2020-07-25 DIAGNOSIS — F39 Unspecified mood [affective] disorder: Secondary | ICD-10-CM

## 2020-07-25 DIAGNOSIS — Z5181 Encounter for therapeutic drug level monitoring: Secondary | ICD-10-CM | POA: Diagnosis not present

## 2020-07-25 MED ORDER — DIVALPROEX SODIUM 250 MG PO DR TAB: 250.0000 mg | DELAYED_RELEASE_TABLET | Freq: Two times a day (BID) | ORAL | 1 refills | Status: DC

## 2020-07-25 NOTE — Progress Notes (Signed)
Patient ID: [name] [name], male    DOB: Jul 24, 1995, 24 y.o.   MRN: [id number]  This visit was conducted in person.  BP 122/74   Pulse 75   Temp 98.6 F (37 C) (Temporal)   Ht 5\' 10"  (1.778 m)   Wt 150 lb (68 kg)   SpO2 98%   BMI 21.52 kg/m    CC: discuss mood  Subjective:   HPI: [name] is a 25 y.o. male presenting on 07/25/2020 for Anxiety and Depression   Longstanding mood difficulties. Has seen counselors in the past. Has had difficulty tolerating medications previously tried (buspar, celexa, prozac, zoloft, wellbutrin). Latest medication tried trazodone helped with sleep - he currently takes 50mg  nightly.   Referred to Dr 07/27/2020 this year for ADHD evaluation. Tested negative for ADHD but suspected possible Bipolar II disorder along with GAD, r/o OCD.   He saw Dr at [facility] - concern for bipolar I disorder - started on abilify 5mg  which he has not tolerated well (headache, vomiting). Desires second opinion and different medication.   Lives with GF.      Relevant past medical, surgical, family and social history reviewed and updated as indicated. Interim medical history since our last visit reviewed. Allergies and medications reviewed and updated. Outpatient Medications Prior to Visit  Medication Sig Dispense Refill   albuterol (VENTOLIN HFA) 108 (90 Base) MCG/ACT inhaler Inhale 2 puffs into the lungs every 6 (six) hours as needed for wheezing. 18 g 3   cetirizine (ZYRTEC) 10 MG tablet Take 10 mg by mouth daily as needed.      traZODone (DESYREL) 50 MG tablet TAKE 1 TO 2 TABLETS BY MOUTH AT BEDTIME AS DIRECTED 60 tablet 3   No facility-administered medications prior to visit.     Per HPI unless specifically indicated in ROS section below Review of Systems  Objective:  BP 122/74   Pulse 75   Temp 98.6 F (37 C) (Temporal)   Ht 5\' 10"  (1.778 m)   Wt 150 lb (68 kg)   SpO2 98%   BMI 21.52 kg/m   Wt Readings from Last  3 Encounters:  07/25/20 150 lb (68 kg)  10/07/19 160 lb 1 oz (72.6 kg)  04/20/19 160 lb 9 oz (72.8 kg)      Physical Exam Vitals and nursing note reviewed.  Constitutional:      Appearance: Normal appearance. He is not ill-appearing.  Neck:     Thyroid: No thyroid mass or thyromegaly.  Cardiovascular:     Rate and Rhythm: Normal rate and regular rhythm.     Pulses: Normal pulses.     Heart sounds: Normal heart sounds. No murmur heard. Pulmonary:     Effort: Pulmonary effort is normal. No respiratory distress.     Breath sounds: Normal breath sounds. No wheezing, rhonchi or rales.  Neurological:     Mental Status: He is alert.  Psychiatric:        Attention and Perception: Attention and perception normal.        Mood and Affect: Mood normal.        Speech: Speech normal.        Behavior: Behavior normal. Behavior is cooperative.        Thought Content: Thought content normal.        Cognition and Memory: Cognition and memory normal.        Judgment: Judgment normal.      Lab Results  Component Value  Date   TSH 1.75 10/07/2019    Depression screen [facility] 2/9 07/25/2020 02/07/2020 10/07/2019 04/20/2019 08/20/2018  Decreased Interest 1 3 0 1 1  Down, Depressed, Hopeless 1 3 0 1 1  PHQ - 2 Score 2 6 0 2 2  Altered sleeping 2 3 2 3 3   Tired, decreased energy 2 3 2 3 1   Change in appetite 1 2 1  0 0  Feeling bad or failure about yourself  1 1 2 2  0  Trouble concentrating 2 3 0 2 2  Moving slowly or fidgety/restless 1 1 1  0 0  Suicidal thoughts 0 2 2 2 1   PHQ-9 Score 11 21 10 14 9   Difficult doing work/chores - Extremely dIfficult - - -    GAD 7 : Generalized Anxiety Score 07/25/2020 02/07/2020 10/07/2019 04/20/2019  Nervous, Anxious, on Edge 3 2 2 3   Control/stop worrying 2 1 1 2   Worry too much - different things 2 3 2 2   Trouble relaxing 2 3 2 2   Restless 1 2 1 1   Easily annoyed or irritable 2 1 1 1   Afraid - awful might happen 2 2 2 3   Total GAD 7 Score 14 14 11 14   Anxiety  Difficulty - Extremely difficult - -   Assessment & Plan:  This visit occurred during the SARS-CoV-2 public health emergency.  Safety protocols were in place, including screening questions prior to the visit, additional usage of staff PPE, and extensive cleaning of exam room while observing appropriate contact time as indicated for disinfecting solutions.   Problem List Items Addressed This Visit     Mood disorder (HCC) - Primary    MQD positive today - scanned.  Concern for bipolar disorder, I vs II on recent evaluations (psychological testing, psychiatric eval).  Desires to see new psychiatrist but wants to wait until he gets new insurance over the next few weeks as current insurance doesn't cover mental healthcare and he has large bill from evaluations this year.  Did not tolerate aricept. Will trial depakote 250mg  bid, with labs in 1 wk. Reviewed side effects to watch for including but not limited to GI upset with nausea/vomiting, weight gain, tremor. Specifically discussed monitoring for hepatic symptoms or pancreatitis.        Relevant Orders   TSH   CBC with Differential/Platelet   Valproic acid level   Hepatic function panel   Other Visit Diagnoses     Encounter for therapeutic drug level monitoring       Relevant Orders   TSH   CBC with Differential/Platelet   Valproic acid level   Hepatic function panel        Meds ordered this encounter  Medications   divalproex (DEPAKOTE) 250 MG DR tablet    Sig: Take 1 tablet (250 mg total) by mouth 2 (two) times daily.    Dispense:  60 tablet    Refill:  1    Orders Placed This Encounter  Procedures   TSH    Standing Status:   Future    Standing Expiration Date:   07/25/2021   CBC with Differential/Platelet    Standing Status:   Future    Standing Expiration Date:   07/25/2021   Valproic acid level    Standing Status:   Future    Standing Expiration Date:   07/25/2021   Hepatic function panel    Standing Status:    Future    Standing Expiration Date:   07/25/2021  Patient Instructions  Call psychiatry for appointment.  Start depakote while we get you in with psychiatry  Take 250mg  twice daily.  Return in 1 week for lab work.   Follow up plan: Return if symptoms worsen or fail to improve.  , MD

## 2020-07-25 NOTE — Assessment & Plan Note (Addendum)
MQD positive today - scanned.  Concern for bipolar disorder, I vs II on recent evaluations (psychological testing, psychiatric eval).  Desires to see new psychiatrist but wants to wait until he gets new insurance over the next few weeks as current insurance doesn't cover mental healthcare and he has large bill from evaluations this year.  Did not tolerate aricept. Will trial depakote 250mg  bid, with labs in 1 wk. Reviewed side effects to watch for including but not limited to GI upset with nausea/vomiting, weight gain, tremor. Specifically discussed monitoring for hepatic symptoms or pancreatitis.

## 2020-07-25 NOTE — Patient Instructions (Addendum)
Call psychiatry for appointment.  Start depakote while we get you in with psychiatry  Take 250mg  twice daily.  Return in 1 week for lab work.

## 2020-08-01 ENCOUNTER — Other Ambulatory Visit (INDEPENDENT_AMBULATORY_CARE_PROVIDER_SITE_OTHER): Payer: BLUE CROSS/BLUE SHIELD

## 2020-08-01 ENCOUNTER — Other Ambulatory Visit: Payer: Self-pay

## 2020-08-01 DIAGNOSIS — Z5181 Encounter for therapeutic drug level monitoring: Secondary | ICD-10-CM

## 2020-08-01 DIAGNOSIS — F39 Unspecified mood [affective] disorder: Secondary | ICD-10-CM

## 2020-08-02 LAB — CBC WITH DIFFERENTIAL/PLATELET
Basophils Absolute: 0.1 10*3/uL (ref 0.0–0.1)
Basophils Relative: 0.7 % (ref 0.0–3.0)
Eosinophils Absolute: 0.3 10*3/uL (ref 0.0–0.7)
Eosinophils Relative: 3.4 % (ref 0.0–5.0)
HCT: 46 % (ref 39.0–52.0)
Hemoglobin: 16.1 g/dL (ref 13.0–17.0)
Lymphocytes Relative: 28.5 % (ref 12.0–46.0)
Lymphs Abs: 2.5 10*3/uL (ref 0.7–4.0)
MCHC: 35.1 g/dL (ref 30.0–36.0)
MCV: 91.5 fl (ref 78.0–100.0)
Monocytes Absolute: 0.5 10*3/uL (ref 0.1–1.0)
Monocytes Relative: 6.1 % (ref 3.0–12.0)
Neutro Abs: 5.3 10*3/uL (ref 1.4–7.7)
Neutrophils Relative %: 61.3 % (ref 43.0–77.0)
Platelets: 324 10*3/uL (ref 150.0–400.0)
RBC: 5.03 Mil/uL (ref 4.22–5.81)
RDW: 12.9 % (ref 11.5–15.5)
WBC: 8.6 10*3/uL (ref 4.0–10.5)

## 2020-08-02 LAB — VALPROIC ACID LEVEL: Valproic Acid Lvl: 62.6 mg/L (ref 50.0–100.0)

## 2020-08-02 LAB — HEPATIC FUNCTION PANEL
ALT: 11 U/L (ref 0–53)
AST: 14 U/L (ref 0–37)
Albumin: 4.8 g/dL (ref 3.5–5.2)
Alkaline Phosphatase: 65 U/L (ref 39–117)
Bilirubin, Direct: 0.3 mg/dL (ref 0.0–0.3)
Total Bilirubin: 1.7 mg/dL — ABNORMAL HIGH (ref 0.2–1.2)
Total Protein: 7.2 g/dL (ref 6.0–8.3)

## 2020-08-02 LAB — TSH: TSH: 3.85 u[IU]/mL (ref 0.35–5.50)

## 2020-09-10 ENCOUNTER — Encounter: Payer: Self-pay | Admitting: Family Medicine

## 2020-09-10 DIAGNOSIS — F39 Unspecified mood [affective] disorder: Secondary | ICD-10-CM

## 2020-09-19 ENCOUNTER — Other Ambulatory Visit: Payer: Self-pay | Admitting: Family Medicine

## 2020-09-20 NOTE — Telephone Encounter (Signed)
Depakote Last filled:  08/22/20, #60 Last OV:  07/25/20, mood f/u Next OV:  10/08/20, CPE

## 2020-10-08 ENCOUNTER — Other Ambulatory Visit: Payer: Self-pay

## 2020-10-08 ENCOUNTER — Encounter: Payer: Self-pay | Admitting: Family Medicine

## 2020-10-08 ENCOUNTER — Ambulatory Visit (INDEPENDENT_AMBULATORY_CARE_PROVIDER_SITE_OTHER): Payer: BC Managed Care – PPO | Admitting: Family Medicine

## 2020-10-08 VITALS — BP 124/86 | HR 64 | Temp 97.9°F | Ht 70.25 in | Wt 153.2 lb

## 2020-10-08 DIAGNOSIS — Z23 Encounter for immunization: Secondary | ICD-10-CM

## 2020-10-08 DIAGNOSIS — Z0000 Encounter for general adult medical examination without abnormal findings: Secondary | ICD-10-CM

## 2020-10-08 DIAGNOSIS — F39 Unspecified mood [affective] disorder: Secondary | ICD-10-CM

## 2020-10-08 DIAGNOSIS — R5383 Other fatigue: Secondary | ICD-10-CM

## 2020-10-08 DIAGNOSIS — G4700 Insomnia, unspecified: Secondary | ICD-10-CM | POA: Diagnosis not present

## 2020-10-08 NOTE — Assessment & Plan Note (Addendum)
Bipolar I vs II. Stable period on depakote 250mg  BID. Did not tolerate abilify. Has psych appt tomorrow to establish care. Will defer medication choices to them. Appreciate their care.

## 2020-10-08 NOTE — Addendum Note (Signed)
Addended by: Nanci Pina on: 10/08/2020 02:55 PM   Modules accepted: Orders

## 2020-10-08 NOTE — Progress Notes (Signed)
Patient ID: [name] [name], male    DOB: 1995/06/21, 25 y.o.   MRN: [id number]  This visit was conducted in person.  BP 124/86   Pulse 64   Temp 97.9 F (36.6 C) (Temporal)   Ht 5' 10.25" (1.784 m)   Wt 153 lb 4 oz (69.5 kg)   SpO2 97%   BMI 21.83 kg/m    CC: CPE Subjective:   HPI: [name] [name] is a 25 y.o. male presenting on 10/08/2020 for Annual Exam   Longstanding mood difficulties. Referred to Dr Reggy Eye early 2022 for psychological testing. Ruled out ADHD, concern for Bipolar II along with GAD, r/o OCD. Did not tolerate abilify (headache, vomiting), started on depakote 250mg  BID which he has tolerated well. Pending establishment with psychiatry. A referral has been plaecd to [facility] - first appt scheduled for tomorrow.   Preventative: Flu shot yearly COVID vaccine - Pfizer 03/2019 x2 Tdap 2009, 09/2019 Non smoker Seat belt use discussed  Sunscreen use discussed. No changing moles on skin.  Sleep - 4-6 hours/night despite dedicating 10 hours of sleep. Trouble falling asleep. Alcohol - 1 mixed drink per month  Rec drugs - none Currently sexually active with 1 partner - monogamous with GF. Partner uses [facility] Dentist q6 mo  Eye doctor yearly   Lives in [place] with GF Occ: Working as [employer]. Enjoying work  Activity: walking regularly at work  Diet: good water, fruits, vegetables      Relevant past medical, surgical, family and social history reviewed and updated as indicated. Interim medical history since our last visit reviewed. Allergies and medications reviewed and updated. Outpatient Medications Prior to Visit  Medication Sig Dispense Refill   albuterol (VENTOLIN HFA) 108 (90 Base) MCG/ACT inhaler Inhale 2 puffs into the lungs every 6 (six) hours as needed for wheezing. 18 g 3   cetirizine (ZYRTEC) 10 MG tablet Take 10 mg by mouth daily as needed.      divalproex (DEPAKOTE) 250 MG DR tablet TAKE 1 TABLET  BY MOUTH TWICE A DAY 60 tablet 1   traZODone (DESYREL) 50 MG tablet TAKE 1 TO 2 TABLETS BY MOUTH AT BEDTIME AS DIRECTED 60 tablet 3   No facility-administered medications prior to visit.     Per HPI unless specifically indicated in ROS section below Review of Systems  Constitutional:  Negative for activity change, appetite change, chills, fatigue, fever and unexpected weight change.  HENT:  Negative for hearing loss.   Eyes:  Negative for visual disturbance.  Respiratory:  Positive for cough (allergies). Negative for chest tightness, shortness of breath and wheezing.   Cardiovascular:  Negative for chest pain, palpitations and leg swelling.  Gastrointestinal:  Negative for abdominal distention, abdominal pain, blood in stool, constipation, diarrhea, nausea and vomiting.  Genitourinary:  Negative for difficulty urinating and hematuria.  Musculoskeletal:  Negative for arthralgias, myalgias and neck pain.  Skin:  Negative for rash.  Neurological:  Negative for dizziness, seizures, syncope and headaches.  Hematological:  Negative for adenopathy. Does not bruise/bleed easily.  Psychiatric/Behavioral:  Negative for dysphoric mood. The patient is not nervous/anxious.    Objective:  BP 124/86   Pulse 64   Temp 97.9 F (36.6 C) (Temporal)   Ht 5' 10.25" (1.784 m)   Wt 153 lb 4 oz (69.5 kg)   SpO2 97%   BMI 21.83 kg/m   Wt Readings from Last 3 Encounters:  10/08/20 153 lb 4 oz (69.5 kg)  07/25/20 150 lb (68 kg)  10/07/19 160 lb 1 oz (72.6 kg)      Physical Exam Vitals and nursing note reviewed.  Constitutional:      General: He is not in acute distress.    Appearance: Normal appearance. He is well-developed. He is not ill-appearing.  HENT:     Head: Normocephalic and atraumatic.     Right Ear: Hearing, tympanic membrane, ear canal and external ear normal.     Left Ear: Hearing, tympanic membrane, ear canal and external ear normal.  Eyes:     General: No scleral icterus.     Extraocular Movements: Extraocular movements intact.     Conjunctiva/sclera: Conjunctivae normal.     Pupils: Pupils are equal, round, and reactive to light.  Neck:     Thyroid: No thyroid mass or thyromegaly.  Cardiovascular:     Rate and Rhythm: Normal rate and regular rhythm.     Pulses: Normal pulses.          Radial pulses are 2+ on the right side and 2+ on the left side.     Heart sounds: Normal heart sounds. No murmur heard. Pulmonary:     Effort: Pulmonary effort is normal. No respiratory distress.     Breath sounds: Normal breath sounds. No wheezing, rhonchi or rales.  Abdominal:     General: Bowel sounds are normal. There is no distension.     Palpations: Abdomen is soft. There is no mass.     Tenderness: There is no abdominal tenderness. There is no guarding or rebound.     Hernia: No hernia is present.  Musculoskeletal:        General: Normal range of motion.     Cervical back: Normal range of motion and neck supple.     Right lower leg: No edema.     Left lower leg: No edema.  Lymphadenopathy:     Cervical: No cervical adenopathy.  Skin:    General: Skin is warm and dry.     Findings: No rash.  Neurological:     General: No focal deficit present.     Mental Status: He is alert and oriented to person, place, and time.  Psychiatric:        Mood and Affect: Mood normal.        Behavior: Behavior normal.        Thought Content: Thought content normal.        Judgment: Judgment normal.      Results for orders placed or performed in visit on 08/01/20  Hepatic function panel  Result Value Ref Range   Total Bilirubin 1.7 (H) 0.2 - 1.2 mg/dL   Bilirubin, Direct 0.3 0.0 - 0.3 mg/dL   Alkaline Phosphatase 65 39 - 117 U/L   AST 14 0 - 37 U/L   ALT 11 0 - 53 U/L   Total Protein 7.2 6.0 - 8.3 g/dL   Albumin 4.8 3.5 - 5.2 g/dL  Valproic acid level  Result Value Ref Range   Valproic Acid Lvl 62.6 50.0 - 100.0 mg/L  CBC with Differential/Platelet  Result Value Ref  Range   WBC 8.6 4.0 - 10.5 K/uL   RBC 5.03 4.22 - 5.81 Mil/uL   Hemoglobin 16.1 13.0 - 17.0 g/dL   HCT 95.6 21.3 - 08.6 %   MCV 91.5 78.0 - 100.0 fl   MCHC 35.1 30.0 - 36.0 g/dL   RDW 57.8 46.9 - 62.9 %   Platelets 324.0 150.0 -  400.0 K/uL   Neutrophils Relative % 61.3 43.0 - 77.0 %   Lymphocytes Relative 28.5 12.0 - 46.0 %   Monocytes Relative 6.1 3.0 - 12.0 %   Eosinophils Relative 3.4 0.0 - 5.0 %   Basophils Relative 0.7 0.0 - 3.0 %   Neutro Abs 5.3 1.4 - 7.7 K/uL   Lymphs Abs 2.5 0.7 - 4.0 K/uL   Monocytes Absolute 0.5 0.1 - 1.0 K/uL   Eosinophils Absolute 0.3 0.0 - 0.7 K/uL   Basophils Absolute 0.1 0.0 - 0.1 K/uL  TSH  Result Value Ref Range   TSH 3.85 0.35 - 5.50 uIU/mL   Depression screen [facility] 2/9 10/08/2020 07/25/2020 02/07/2020 10/07/2019 04/20/2019  Decreased Interest 1 1 3  0 1  Down, Depressed, Hopeless 1 1 3  0 1  PHQ - 2 Score 2 2 6  0 2  Altered sleeping 2 2 3 2 3   Tired, decreased energy 2 2 3 2 3   Change in appetite 1 1 2 1  0  Feeling bad or failure about yourself  0 1 1 2 2   Trouble concentrating 2 2 3  0 2  Moving slowly or fidgety/restless 1 1 1 1  0  Suicidal thoughts 0 0 2 2 2   PHQ-9 Score 10 11 21 10 14   Difficult doing work/chores - - Extremely dIfficult - -    GAD 7 : Generalized Anxiety Score 10/08/2020 07/25/2020 02/07/2020 10/07/2019  Nervous, Anxious, on Edge 3 3 2 2   Control/stop worrying 2 2 1 1   Worry too much - different things 1 2 3 2   Trouble relaxing 1 2 3 2   Restless 2 1 2 1   Easily annoyed or irritable 3 2 1 1   Afraid - awful might happen 2 2 2 2   Total GAD 7 Score 14 14 14 11   Anxiety Difficulty - - Extremely difficult -   Assessment & Plan:  This visit occurred during the SARS-CoV-2 public health emergency.  Safety protocols were in place, including screening questions prior to the visit, additional usage of staff PPE, and extensive cleaning of exam room while observing appropriate contact time as indicated for disinfecting solutions.    Problem List Items Addressed This Visit     Health maintenance examination - Primary (Chronic)    Preventative protocols reviewed and updated unless pt declined. Discussed healthy diet and lifestyle.       Mood disorder (HCC)    Bipolar I vs II. Stable period on depakote 250mg  BID. Did not tolerate abilify. Has psych appt tomorrow to establish care. Will defer medication choices to them. Appreciate their care.       Relevant Orders   Comprehensive metabolic panel   CBC with Differential/Platelet   Valproic acid level   Insomnia    Ongoing struggle, averages 4-6 hours of sleep despite enough dedicated sleep time (10 hours/day). Aware he may try trazodone along with depakote.       Other Visit Diagnoses     Fatigue, unspecified type       Relevant Orders   Vitamin B12   VITAMIN D 25 Hydroxy (Vit-D Deficiency, Fractures)        No orders of the defined types were placed in this encounter.  Orders Placed This Encounter  Procedures   Comprehensive metabolic panel   CBC with Differential/Platelet   Vitamin B12   VITAMIN D 25 Hydroxy (Vit-D Deficiency, Fractures)   Valproic acid level     Patient instructions: Flu shot today  Labs today.  Good to  see you today.  Return as needed or in 1 year for next physical.   Follow up plan: Return in about 1 year (around 10/08/2021), or if symptoms worsen or fail to improve.  Eustaquio Boyden, MD

## 2020-10-08 NOTE — Assessment & Plan Note (Addendum)
Preventative protocols reviewed and updated unless pt declined. Discussed healthy diet and lifestyle.  

## 2020-10-08 NOTE — Patient Instructions (Addendum)
Flu shot today  Labs today  Good to see you today. Return as needed or in 1 year for next physical.   Health Maintenance, Male Adopting a healthy lifestyle and getting preventive care are important in promoting health and wellness. Ask your health care provider about: The right schedule for you to have regular tests and exams. Things you can do on your own to prevent diseases and keep yourself healthy. What should I know about diet, weight, and exercise? Eat a healthy diet  Eat a diet that includes plenty of vegetables, fruits, low-fat dairy products, and lean protein. Do not eat a lot of foods that are high in solid fats, added sugars, or sodium. Maintain a healthy weight Body mass index (BMI) is a measurement that can be used to identify possible weight problems. It estimates body fat based on height and weight. Your health care provider can help determine your BMI and help you achieve or maintain a healthy weight. Get regular exercise Get regular exercise. This is one of the most important things you can do for your health. Most adults should: Exercise for at least 150 minutes each week. The exercise should increase your heart rate and make you sweat (moderate-intensity exercise). Do strengthening exercises at least twice a week. This is in addition to the moderate-intensity exercise. Spend less time sitting. Even light physical activity can be beneficial. Watch cholesterol and blood lipids Have your blood tested for lipids and cholesterol at 24 years of age, then have this test every 5 years. You may need to have your cholesterol levels checked more often if: Your lipid or cholesterol levels are high. You are older than 24 years of age. You are at high risk for heart disease. What should I know about cancer screening? Many types of cancers can be detected early and may often be prevented. Depending on your health history and family history, you may need to have cancer screening at  various ages. This may include screening for: Colorectal cancer. Prostate cancer. Skin cancer. Lung cancer. What should I know about heart disease, diabetes, and high blood pressure? Blood pressure and heart disease High blood pressure causes heart disease and increases the risk of stroke. This is more likely to develop in people who have high blood pressure readings, are of African descent, or are overweight. Talk with your health care provider about your target blood pressure readings. Have your blood pressure checked: Every 3-5 years if you are 18-39 years of age. Every year if you are 40 years old or older. If you are between the ages of 65 and 75 and are a current or former smoker, ask your health care provider if you should have a one-time screening for abdominal aortic aneurysm (AAA). Diabetes Have regular diabetes screenings. This checks your fasting blood sugar level. Have the screening done: Once every three years after age 45 if you are at a normal weight and have a low risk for diabetes. More often and at a younger age if you are overweight or have a high risk for diabetes. What should I know about preventing infection? Hepatitis B If you have a higher risk for hepatitis B, you should be screened for this virus. Talk with your health care provider to find out if you are at risk for hepatitis B infection. Hepatitis C Blood testing is recommended for: Everyone born from 1945 through 1965. Anyone with known risk factors for hepatitis C. Sexually transmitted infections (STIs) You should be screened each year for   STIs, including gonorrhea and chlamydia, if: You are sexually active and are younger than 24 years of age. You are older than 24 years of age and your health care provider tells you that you are at risk for this type of infection. Your sexual activity has changed since you were last screened, and you are at increased risk for chlamydia or gonorrhea. Ask your health care  provider if you are at risk. Ask your health care provider about whether you are at high risk for HIV. Your health care provider may recommend a prescription medicine to help prevent HIV infection. If you choose to take medicine to prevent HIV, you should first get tested for HIV. You should then be tested every 3 months for as long as you are taking the medicine. Follow these instructions at home: Lifestyle Do not use any products that contain nicotine or tobacco, such as cigarettes, e-cigarettes, and chewing tobacco. If you need help quitting, ask your health care provider. Do not use street drugs. Do not share needles. Ask your health care provider for help if you need support or information about quitting drugs. Alcohol use Do not drink alcohol if your health care provider tells you not to drink. If you drink alcohol: Limit how much you have to 0-2 drinks a day. Be aware of how much alcohol is in your drink. In the [place] one drink equals one 12 oz bottle of beer (355 mL), one 5 oz glass of wine (148 mL), or one 1 oz glass of hard liquor (44 mL). General instructions Schedule regular health, dental, and eye exams. Stay current with your vaccines. Tell your health care provider if: You often feel depressed. You have ever been abused or do not feel safe at home. Summary Adopting a healthy lifestyle and getting preventive care are important in promoting health and wellness. Follow your health care provider's instructions about healthy diet, exercising, and getting tested or screened for diseases. Follow your health care provider's instructions on monitoring your cholesterol and blood pressure. This information is not intended to replace advice given to you by your health care provider. Make sure you discuss any questions you have with your health care provider. Document Revised: 03/09/2020 Document Reviewed: 12/23/2017 Elsevier Patient Education  2022 [employer]  

## 2020-10-08 NOTE — Assessment & Plan Note (Signed)
Ongoing struggle, averages 4-6 hours of sleep despite enough dedicated sleep time (10 hours/day). Aware he may try trazodone along with depakote.

## 2020-10-09 ENCOUNTER — Encounter: Payer: Self-pay | Admitting: Psychiatry

## 2020-10-09 ENCOUNTER — Telehealth (INDEPENDENT_AMBULATORY_CARE_PROVIDER_SITE_OTHER): Payer: BC Managed Care – PPO | Admitting: Psychiatry

## 2020-10-09 DIAGNOSIS — F3181 Bipolar II disorder: Secondary | ICD-10-CM

## 2020-10-09 DIAGNOSIS — F411 Generalized anxiety disorder: Secondary | ICD-10-CM

## 2020-10-09 DIAGNOSIS — Z79899 Other long term (current) drug therapy: Secondary | ICD-10-CM

## 2020-10-09 LAB — CBC WITH DIFFERENTIAL/PLATELET
Basophils Absolute: 0 10*3/uL (ref 0.0–0.1)
Basophils Relative: 0.4 % (ref 0.0–3.0)
Eosinophils Absolute: 0.2 10*3/uL (ref 0.0–0.7)
Eosinophils Relative: 2.1 % (ref 0.0–5.0)
HCT: 43.9 % (ref 39.0–52.0)
Hemoglobin: 15.5 g/dL (ref 13.0–17.0)
Lymphocytes Relative: 31.7 % (ref 12.0–46.0)
Lymphs Abs: 2.4 10*3/uL (ref 0.7–4.0)
MCHC: 35.3 g/dL (ref 30.0–36.0)
MCV: 91.4 fl (ref 78.0–100.0)
Monocytes Absolute: 0.4 10*3/uL (ref 0.1–1.0)
Monocytes Relative: 5.4 % (ref 3.0–12.0)
Neutro Abs: 4.5 10*3/uL (ref 1.4–7.7)
Neutrophils Relative %: 60.4 % (ref 43.0–77.0)
Platelets: 288 10*3/uL (ref 150.0–400.0)
RBC: 4.8 Mil/uL (ref 4.22–5.81)
RDW: 13.1 % (ref 11.5–15.5)
WBC: 7.5 10*3/uL (ref 4.0–10.5)

## 2020-10-09 LAB — COMPREHENSIVE METABOLIC PANEL
ALT: 11 U/L (ref 0–53)
AST: 13 U/L (ref 0–37)
Albumin: 4.9 g/dL (ref 3.5–5.2)
Alkaline Phosphatase: 53 U/L (ref 39–117)
BUN: 16 mg/dL (ref 6–23)
CO2: 26 mEq/L (ref 19–32)
Calcium: 9.7 mg/dL (ref 8.4–10.5)
Chloride: 103 mEq/L (ref 96–112)
Creatinine, Ser: 0.76 mg/dL (ref 0.40–1.50)
GFR: 125.92 mL/min (ref >60.00)
Glucose, Bld: 87 mg/dL (ref 70–99)
Potassium: 4.1 mEq/L (ref 3.5–5.1)
Sodium: 140 mEq/L (ref 135–145)
Total Bilirubin: 1.5 mg/dL — ABNORMAL HIGH (ref 0.2–1.2)
Total Protein: 7.2 g/dL (ref 6.0–8.3)

## 2020-10-09 LAB — VITAMIN D 25 HYDROXY (VIT D DEFICIENCY, FRACTURES): VITD: 30.02 ng/mL (ref 30.00–100.00)

## 2020-10-09 LAB — VITAMIN B12: Vitamin B-12: 337 pg/mL (ref 211–911)

## 2020-10-09 LAB — VALPROIC ACID LEVEL: Valproic Acid Lvl: 75.1 mg/L (ref 50.0–100.0)

## 2020-10-09 MED ORDER — AMITRIPTYLINE HCL 10 MG PO TABS: 10.0000 mg | ORAL_TABLET | Freq: Every day | ORAL | 0 refills | Status: DC

## 2020-10-09 NOTE — Progress Notes (Signed)
Virtual Visit via Video Note  I connected with [name] [name] on 10/09/20 at  9:00 AM EDT by a video enabled telemedicine application and verified that I am speaking with the correct person using two identifiers. Location Provider Location : [facility] Patient Location : Home  Participants: Patient , Provider    I discussed the limitations of evaluation and management by telemedicine and the availability of in person appointments. The patient expressed understanding and agreed to proceed.   I discussed the assessment and treatment plan with the patient. The patient [name] provided an opportunity to ask questions and all were answered. The patient agreed with the plan and demonstrated an understanding of the instructions.   The patient [name] advised to call back or seek an in-person evaluation if the symptoms worsen or if the condition fails to improve as anticipated.     Psychiatric Initial Adult Assessment   [name] MRN:  [id number] Date of Evaluation:  10/09/2020 Referral Source: [name] Gallant MD Chief Complaint:   Chief Complaint   Establish Care; [name]; Depression    Visit Diagnosis:    ICD-10-CM   1. Bipolar 2 [name] (HCC)  F31.81    mixed episodes, most recent hypomanic    2. GAD (generalized [name] [name])  F41.1 amitriptyline (ELAVIL) 10 MG tablet    3. High risk medication use  Z79.899 EKG 12-Lead    Valproic acid level      History of Present Illness:  [name] [name] is a 25 year old Caucasian [name], [name] [name], [name] [name], [name] [name], [name] [name], [name] [name], [name] evaluated by telemedicine today.  Patient reports he recently underwent neuropsychological testing, May 21, 2020.  Patient reports he [name] diagnosed with bipolar type II, generalized [name] [name] at that time.  His primary care provider started him on Depakote recently currently takes 250 mg twice a day.  Patient reports he always [name] a 'nervous kid'  growing up.  Patient reports when he went to college he noticed that it started affecting his functioning to the point that he [name] socially isolating himself and could not do much.  He hence decided to get help.  His primary care provider tried him on different medications, BuSpar [name] one of the first ones to be tried.  The BuSpar did not do much and hence he [name] switched to several antidepressants like sertraline, Prozac, bupropion, Celexa.  He reports these medications caused him to be numb and hence he could not stay on it.  Medications like Wellbutrin and Abilify [name] tried which caused him severe headaches.  Patient reports mood swings.  He reports he goes through episodes of feeling irritable, having outbursts which can last for 30 minutes or so, grandiose, spending money, and these tantrums can last all together for a couple of days or more.  Patient reports that he crashes into a depressive episode after that.  He [name] had major depressive symptoms in the past, last one may have been in February 2022 when he had severe sadness, social withdrawal, appetite changes, anhedonia as well as suicidal thoughts.  Patient reports when he gets extremely anxious or sad he does have suicidal thoughts, last one may have been a month ago.  However he denies any active plans or suicide attempts in the past.  Patient calls himself a Product/process development scientist.  He reports he worries a lot about different things even now.  Patient reports it is usually triggered by situational stressors like financial problems.  Patient reports he [name]  [name] attacks and often feels jittery.[name] symptoms are getting worse since the past few weeks.  He [name] not been able to find the right medication.  He did try therapy in the past and he [name] with therapist [name] [name] for 2 months.  However once he started his job he could not continue due to his work schedule.  He hence uses some of the techniques that he learned in therapy which helps to some  extent.  Patient denies any history of trauma.  Patient does report history of obsessions, checking behavior.  He however reports he does not since he is very anxious about whether he [name] made a mistake or not.   Associated Signs/Symptoms: Depression Symptoms:  depressed mood, anhedonia, insomnia, psychomotor agitation, psychomotor retardation, fatigue, difficulty concentrating, [name], (Hypo) Manic Symptoms:  Distractibility, Elevated Mood, Grandiosity, Impulsivity, Irritable Mood, Labiality of Mood, [name] Symptoms:  Excessive Worry, Panic Symptoms, Psychotic Symptoms:   Denies PTSD Symptoms: Negative  Past Psychiatric History: Patient [name] under the care of primary care provider who [name] treating his [name], [name] tried on medications like sertraline, Prozac, BuSpar, bupropion, Celexa, Abilify.  Patient also [name] a history of being diagnosed with mood [name].  Patient had neuropsychological testing done on May 21, 2020-diagnosed with bipolar 2 [name], GAD.  Patient [name] under the care of therapist Mr. [name] [name] for 2 months in the past.  This [name] a year ago.  Patient denies any self-injurious behaviors.  Patient denies suicide attempts.  Previous Psychotropic Medications: Yes as noted above.  Substance Abuse History in the last 12 months:  No.  Consequences of Substance Abuse: Negative  Past Medical History:  Past Medical History:  Diagnosis Date   [name] [name]    Deformity, chest wall, congenital    Environmental allergies    mold,pine trees   History of meningitis infancy   OSA (obstructive sleep apnea)    Seasonal allergies     Past Surgical History:  Procedure Laterality Date   TONSILLECTOMY AND ADENOIDECTOMY  2001   ?growing back    Family Psychiatric History: As noted below.  Family History:  Family History  Problem Relation Age of Onset   [name] [name] Mother    Depression Mother    Hypertension Mother    Arthritis Mother        osteo    Depression Father    [name] [name] Father    Hypertension Father    [name] [name] Brother    Cancer Maternal Aunt        ovarian   Arthritis/Rheumatoid Maternal Aunt    Cancer Maternal Uncle        lung   Diabetes Maternal Uncle    Hyperlipidemia Maternal Uncle    Diabetes Paternal Uncle    Cancer Maternal Grandfather        lung   Heart disease Maternal Grandfather    Cancer Maternal Grandmother        uterine   Hyperlipidemia Maternal Grandmother    Cancer Paternal Grandfather        penile   Suicidality Paternal Grandmother    Diabetes Mellitus I Cousin        juvenile   ADD / ADHD Half-Brother    CAD Neg Hx    Stroke Neg Hx     Social History:   Social History   Socioeconomic History   Marital status: Single    Spouse name: Not on file   Number of children: Not on file   Years of education: Not  on file   Highest education level: Not on file  Occupational History   Occupation: student  Tobacco Use   Smoking status: Never   Smokeless tobacco: Never  Vaping Use   Vaping Use: Never used  Substance and Sexual Activity   Alcohol use: No   Drug use: No   Sexual activity: Not on file  Other Topics Concern   Not on file  Social History Narrative   [name] in [place]    Older brother is paramedic in [place]   [employer] -> [employer] -> [employer] grad school for [employer]   A/Bs.    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  He currently works as a Chief Financial Officer.  He [name] with his partner of 1-1/2 years in [place].  Patient [name] a  half-brother who is older and a younger brother.  Patient denies any legal problems.  Patient denies any history of trauma.  Allergies:   Allergies  Allergen Reactions   Peanut-Containing Drug Products    Wellbutrin [Bupropion] Other (See Comments)    Severe headache affecting vision    Metabolic [name] Labs: No results found for: HGBA1C, MPG No results found for: PROLACTIN Lab Results  Component Value Date   CHOL 120 10/07/2019   TRIG 49 10/07/2019   HDL 53 10/07/2019   CHOLHDL 2.3 10/07/2019   LDLCALC 53 10/07/2019   Lab Results  Component Value Date   TSH 3.85 08/01/2020    Therapeutic Level Labs: No results found for: LITHIUM No results found for: CBMZ Lab Results  Component Value Date   VALPROATE 75.1 10/08/2020    Current Medications: Current Outpatient Medications  Medication Sig Dispense Refill   amitriptyline (ELAVIL) 10 MG tablet Take 1 tablet (10 mg total) by mouth at bedtime. 15 tablet 0   albuterol (VENTOLIN HFA) 108 (90 Base) MCG/ACT inhaler Inhale 2 puffs into the lungs every 6 (six) hours as needed for wheezing. 18 g 3   cetirizine (ZYRTEC) 10 MG tablet Take 10 mg by mouth daily as needed.      divalproex (DEPAKOTE) 250 MG DR tablet TAKE 1 TABLET BY MOUTH TWICE A DAY 60 tablet 1   No current facility-administered medications for this visit.    Musculoskeletal: Strength & Muscle Tone:  UTA Gait & Station:  Seated Patient leans:  UTA  Psychiatric Specialty Exam: Review of Systems  Psychiatric/Behavioral:  Positive for decreased concentration, dysphoric mood and sleep disturbance. The patient is nervous/anxious.   All other systems reviewed and are negative.  There were no vitals taken for this visit.There is no height or weight on file to calculate BMI.  General Appearance: Casual  Eye Contact:  Good  Speech:  Clear and Coherent  Volume:  Normal  Mood:  Anxious, Depressed, and Irritable  Affect:  Appropriate  Thought Process:  Goal Directed  and Descriptions of Associations: Intact  Orientation:  Full (Time, Place, and Person)  Thought Content:  Logical  Suicidal Thoughts:  No  Homicidal Thoughts:  No  Memory:  Immediate;  Fair Recent;   Fair Remote;   Fair  Judgement:  Fair  Insight:  Fair  Psychomotor Activity:  Normal  Concentration:  Concentration: Fair and Attention Span: Fair  Recall:  [employer] of Knowledge:Fair  Language: Fair  Akathisia:  No  Handed:  Ambidextrous  AIMS (if indicated):  done  Assets:  Communication Skills Desire for Improvement Housing Intimacy Physical Health Resilience Social Support Talents/Skills Transportation Vocational/Educational  ADL's:  Intact  Cognition: WNL  Sleep:   Restless at times   Screenings: GAD-7    Flowsheet Row Video Visit from 10/09/2020 in [facility] Office Visit from 10/08/2020 in [facility] Visit from 07[facility]25/2020 in [facility] Video Visit from 02/07/2020 in [facility] Office Visit from 10/07/2019 in [facility]  Total GAD-7 Score 16 14 14 14 11       PHQ2-9    Flowsheet Row Video Visit from 10/09/2020 in [facility] Office Visit from 10/08/2020 in [facility] Visit from 07/25/2020 in [facility] Video Visit from 02/07/2020 in [facility] Office Visit from 10/07/2019 in [facility] Total Score 0 2 2 6  0  PHQ-9 Total Score -- 10 11 21 10       Flowsheet Row Video Visit from 10/09/2020 in [facility] Video Visit from 02/07/2020 in [facility]  C-SSRS RISK CATEGORY No Risk Error: Q7 should not be populated when Q6 is No       Assessment and Plan: Ranferi Soter is a 25 year old Caucasian [name], [name] [name], [name] with his partner in [place], [name] a history of bipolar type II, GAD, [name]  evaluated by telemedicine today.  Patient is biologically predisposed given family history.  Patient continues to struggle with mood swings, [name] and will benefit from following plan. The patient demonstrates the following risk factors for suicide: Chronic risk factors for suicide include: psychiatric [name] of bipolar and completed suicide in a family member. Acute risk factors for suicide include:  recent stressors as noted in [facility] . Protective factors for this patient include: positive social support, positive therapeutic relationship, coping skills, and hope for the future. Considering these factors, the overall suicide risk at this point appears to be low. Patient is appropriate for outpatient follow up.  Plan  Bipolar 2 [name], mixed features, most recent episode hypomanic-unstable Depakote 250 mg p.o. twice daily.  Patient will need to repeat Depakote level prior to increasing the dosage. Most recent Depakote level [name] yesterday-75.1.  Patient however reports this [name] taken right after taking his Depakote dose for the morning.  GAD-unstable Start Elavil 10 mg p.o. nightly Patient advised to establish care with a therapist.  High risk medication use-order Depakote level.  At risk for prolonged QT syndrome-order EKG.  Provided information to call to schedule [phone].  I have reviewed neuropsychological testing dated May 21, 2020 as noted above.  I have reviewed notes per Dr.Gutierrez --most recent 1 07/25/2020.  Patient [name] being treated for mood [name] and [name] started on Depakote.    This note [name] generated in part or whole with voice recognition software. Voice recognition is usually quite accurate but there are transcription errors that can and very often do occur. I apologize for any typographical errors that were not detected and corrected.     [id number], MD 9/27/20221:54 PM

## 2020-10-09 NOTE — Patient Instructions (Signed)
Amitriptyline Tablets What is this medication? AMITRIPTYLINE (a mee TRIP ti leen) treats depression. It increases the amount of serotonin and norepinephrine in the brain, hormones that help regulate mood. It belongs to a group of medications called tricyclic antidepressants (TCAs). This medicine may be used for other purposes; ask your health care provider or pharmacist if you have questions. COMMON BRAND NAME(S): Elavil, Vanatrip What should I tell my care team before I take this medication? They need to know if you have any of these conditions: An alcohol problem Asthma, trouble breathing Bipolar disorder or schizophrenia Difficulty passing urine, prostate trouble Glaucoma Heart disease or previous heart attack Liver disease Over active thyroid Seizures Thoughts or plans of suicide, a previous suicide attempt, or family history of suicide attempt An unusual or allergic reaction to amitriptyline, other medications, foods, dyes, or preservatives Pregnant or trying to get pregnant Breast-feeding How should I use this medication? Take this medication by mouth with a drink of water. Follow the directions on the prescription label. You can take the tablets with or without food. Take your medication at regular intervals. Do not take it more often than directed. Do not stop taking this medication suddenly except upon the advice of your care team. Stopping this medication too quickly may cause serious side effects or your condition may worsen. A special MedGuide will be given to you by the pharmacist with each prescription and refill. Be sure to read this information carefully each time. Talk to your care team regarding the use of this medication in children. Special care may be needed. Overdosage: If you think you have taken too much of this medicine contact a poison control center or emergency room at once. NOTE: This medicine is only for you. Do not share this medicine with others. What if I  miss a dose? If you miss a dose, take it as soon as you can. If it is almost time for your next dose, take only that dose. Do not take double or extra doses. What may interact with this medication? Do not take this medication with any of the following: Arsenic trioxide Certain medications used to regulate abnormal heartbeat or to treat other heart conditions Cisapride Droperidol Halofantrine Linezolid MAOIs like Carbex, Eldepryl, Marplan, Nardil, and Parnate Methylene blue Other medications for mental depression Phenothiazines like perphenazine, thioridazine and chlorpromazine Pimozide Probucol Procarbazine Sparfloxacin St. John's Wort This medication may also interact with the following: Atropine and related medications like hyoscyamine, scopolamine, tolterodine and others Barbiturate medications for inducing sleep or treating seizures, like phenobarbital Cimetidine Disulfiram Ethchlorvynol Thyroid hormones such as levothyroxine Ziprasidone This list may not describe all possible interactions. Give your health care provider a list of all the medicines, herbs, non-prescription drugs, or dietary supplements you use. Also tell them if you smoke, drink alcohol, or use illegal drugs. Some items may interact with your medicine. What should I watch for while using this medication? Tell your care team if your symptoms do not get better or if they get worse. Visit your care team for regular checks on your progress. Because it may take several weeks to see the full effects of this medication, it is important to continue your treatment as prescribed by your care team. Patients and their families should watch out for new or worsening thoughts of suicide or depression. Also watch out for sudden changes in feelings such as feeling anxious, agitated, panicky, irritable, hostile, aggressive, impulsive, severely restless, overly excited and hyperactive, or not being able to sleep. If   this happens,  especially at the beginning of treatment or after a change in dose, call your care team. You may get drowsy or dizzy. Do not drive, use machinery, or do anything that needs mental alertness until you know how this medication affects you. Do not stand or sit up quickly, especially if you are an older patient. This reduces the risk of dizzy or fainting spells. Alcohol may interfere with the effect of this medication. Avoid alcoholic drinks. Do not treat yourself for coughs, colds, or allergies without asking your care team for advice. Some ingredients can increase possible side effects. Your mouth may get dry. Chewing sugarless gum or sucking hard candy, and drinking plenty of water will help. Contact your care team if the problem does not go away or is severe. This medication may cause dry eyes and blurred vision. If you wear contact lenses you may feel some discomfort. Lubricating drops may help. See your eye doctor if the problem does not go away or is severe. This medication can cause constipation. Try to have a bowel movement at least every 2 to 3 days. If you do not have a bowel movement for 3 days, call your care team. This medication can make you more sensitive to the sun. Keep out of the sun. If you cannot avoid being in the sun, wear protective clothing and use sunscreen. Do not use sun lamps or tanning beds/booths. What side effects may I notice from receiving this medication? Side effects that you should report to your care team as soon as possible: Allergic reactions-skin rash, itching, hives, swelling of the face, lips, tongue, or throat Heart rhythm changes- fast or irregular heartbeat, dizziness, feeling faint or lightheaded, chest pain, trouble breathing Serotonin syndrome-irritability, confusion, fast or irregular heartbeat, muscle stiffness, twitching muscles, sweating, high fever, seizure, chills, vomiting, diarrhea Sudden eye pain or change in vision such as blurry vision, seeing halos  around lights, vision loss Thoughts of suicide or self-harm, worsening mood, feelings of depression Side effects that usually do not require medical attention (report to your care team if they continue or are bothersome): Change in appetite or weight Change in sex drive or performance Constipation Dizziness Drowsiness Dry mouth Tremors This list may not describe all possible side effects. Call your doctor for medical advice about side effects. You may report side effects to FDA at 1-800-FDA-1088. Where should I keep my medication? Keep out of the reach of children and pets. Store at room temperature between 20 and 25 degrees C (68 and 77 degrees F). Throw away any unused medication after the expiration date. NOTE: This sheet is a summary. It may not cover all possible information. If you have questions about this medicine, talk to your doctor, pharmacist, or health care provider.  2022 Elsevier/Gold Standard (2020-01-27 13:09:40)  

## 2020-10-10 ENCOUNTER — Other Ambulatory Visit: Payer: Self-pay | Admitting: Family Medicine

## 2020-10-10 MED ORDER — CYANOCOBALAMIN 500 MCG PO TABS: 500.0000 ug | ORAL_TABLET | Freq: Every day | ORAL | Status: AC

## 2020-10-10 MED ORDER — VITAMIN D3 25 MCG (1000 UT) PO CAPS: 1.0000 | ORAL_CAPSULE | Freq: Every day | ORAL | Status: AC

## 2020-10-11 ENCOUNTER — Encounter: Payer: Self-pay | Admitting: Family Medicine

## 2020-10-11 DIAGNOSIS — F3181 Bipolar II disorder: Secondary | ICD-10-CM

## 2020-10-11 DIAGNOSIS — F39 Unspecified mood [affective] disorder: Secondary | ICD-10-CM

## 2020-10-15 ENCOUNTER — Ambulatory Visit
Admission: RE | Admit: 2020-10-15 | Discharge: 2020-10-15 | Disposition: A | Discharge status: Home / Self Care | Payer: BC Managed Care – PPO | Source: Ambulatory Visit | Attending: Internal Medicine | Admitting: Internal Medicine

## 2020-10-15 ENCOUNTER — Other Ambulatory Visit: Payer: Self-pay

## 2020-10-15 DIAGNOSIS — Z79899 Other long term (current) drug therapy: Secondary | ICD-10-CM | POA: Insufficient documentation

## 2020-10-15 DIAGNOSIS — Z01810 Encounter for preprocedural cardiovascular examination: Secondary | ICD-10-CM | POA: Insufficient documentation

## 2020-10-15 DIAGNOSIS — Z5181 Encounter for therapeutic drug level monitoring: Secondary | ICD-10-CM | POA: Diagnosis not present

## 2020-10-19 ENCOUNTER — Other Ambulatory Visit (INDEPENDENT_AMBULATORY_CARE_PROVIDER_SITE_OTHER): Payer: BC Managed Care – PPO

## 2020-10-19 ENCOUNTER — Other Ambulatory Visit: Payer: Self-pay

## 2020-10-19 DIAGNOSIS — F3181 Bipolar II disorder: Secondary | ICD-10-CM

## 2020-10-19 NOTE — Progress Notes (Signed)
Per Dr. Nicanor Alcon instructions, patient here for labs. Blood drawn from the left cephalic vein. Patient tolerated blood draw well. Sw, cma

## 2020-10-20 LAB — VALPROIC ACID LEVEL: Valproic Acid Lvl: 48.8 mg/L — ABNORMAL LOW (ref 50.0–100.0)

## 2020-10-24 ENCOUNTER — Telehealth (INDEPENDENT_AMBULATORY_CARE_PROVIDER_SITE_OTHER): Payer: BC Managed Care – PPO | Admitting: Psychiatry

## 2020-10-24 ENCOUNTER — Other Ambulatory Visit: Payer: Self-pay

## 2020-10-24 ENCOUNTER — Encounter: Payer: Self-pay | Admitting: Psychiatry

## 2020-10-24 DIAGNOSIS — F3181 Bipolar II disorder: Secondary | ICD-10-CM | POA: Diagnosis not present

## 2020-10-24 DIAGNOSIS — Z79899 Other long term (current) drug therapy: Secondary | ICD-10-CM | POA: Diagnosis not present

## 2020-10-24 DIAGNOSIS — F411 Generalized anxiety disorder: Secondary | ICD-10-CM

## 2020-10-24 MED ORDER — DIVALPROEX SODIUM 250 MG PO DR TAB: 250.0000 mg | DELAYED_RELEASE_TABLET | Freq: Three times a day (TID) | ORAL | 1 refills | Status: DC

## 2020-10-24 MED ORDER — AMITRIPTYLINE HCL 10 MG PO TABS: 10.0000 mg | ORAL_TABLET | Freq: Every day | ORAL | 0 refills | Status: DC

## 2020-10-24 NOTE — Progress Notes (Signed)
Virtual Visit via Video Note  I connected with [name] [name] on 10/24/20 at  8:30 AM EDT by a video enabled telemedicine application and verified that I am speaking with the correct person using two identifiers.  Location Provider Location : [facility] Patient Location : Home  Participants: Patient , Provider    I discussed the limitations of evaluation and management by telemedicine and the availability of in person appointments. The patient expressed understanding and agreed to proceed.    I discussed the assessment and treatment plan with the patient. The patient was provided an opportunity to ask questions and all were answered. The patient agreed with the plan and demonstrated an understanding of the instructions.   The patient was advised to call back or seek an in-person evaluation if the symptoms worsen or if the condition fails to improve as anticipated.   BH MD OP Progress Note  10/24/2020 8:57 AM [name] [name]  MRN:  [id number]  Chief Complaint:  Chief Complaint   Follow-up; Anxiety    WUJ:WJXBJY Dyke is a 25 year old Caucasian male, employed, lives in [place], has a history of mood disorder, bipolar type II, generalized anxiety, asthma, was evaluated by telemedicine today.  Patient lives in [place] and is employed.  Patient today returns reporting continued mood lability, irritability.  He reports he does not have the extreme mood swings anymore however continues to struggle.  He reports the Depakote was beneficial in the beginning however now it looks like the effect has reduced.  Patient continues to have anxiety, worries about things on a regular basis.  He is currently on Elavil 10 mg, recently added.  He has not noticed much benefit.  He denies side effects.  He has not started psychotherapy sessions as discussed last visit, he is trying to find a new therapist.  Patient reports sleep is good.  Patient denies any suicidality, homicidality or perceptual  disturbances.  Patient denies any other concerns today.  Visit Diagnosis:    ICD-10-CM   1. Bipolar 2 disorder (HCC)  F31.81 divalproex (DEPAKOTE) 250 MG DR tablet    Valproic acid level   mixed episodes, most recent hypomanic    2. GAD (generalized anxiety disorder)  F41.1 divalproex (DEPAKOTE) 250 MG DR tablet    amitriptyline (ELAVIL) 10 MG tablet    3. High risk medication use  Z79.899 Valproic acid level      Past Psychiatric History: Reviewed past psychiatric history from progress note from 10/09/2020.  Past trials of sertraline, Prozac, BuSpar, bupropion, Celexa, Abilify.  Patient had neuropsychological testing done on May 21, 2020-diagnosed with bipolar 2 disorder, GAD.  Patient was under the care of therapist Mr.Delight Ovens.    Past Medical History:  Past Medical History:  Diagnosis Date   Asthma    Deformity, chest wall, congenital    Environmental allergies    mold,pine trees   History of meningitis infancy   OSA (obstructive sleep apnea)    Seasonal allergies     Past Surgical History:  Procedure Laterality Date   TONSILLECTOMY AND ADENOIDECTOMY  2001   ?growing back    Family Psychiatric History: Reviewed family psychiatric history from progress note on 10/09/2020  Family History:  Family History  Problem Relation Age of Onset   Anxiety disorder Mother    Depression Mother    Hypertension Mother    Arthritis Mother        osteo   Depression Father    Anxiety disorder Father    Hypertension Father  Asthma Brother    Cancer Maternal Aunt        ovarian   Arthritis/Rheumatoid Maternal Aunt    Cancer Maternal Uncle        lung   Diabetes Maternal Uncle    Hyperlipidemia Maternal Uncle    Diabetes Paternal Uncle    Cancer Maternal Grandfather        lung   Heart disease Maternal Grandfather    Cancer Maternal Grandmother        uterine   Hyperlipidemia Maternal Grandmother    Cancer Paternal Grandfather        penile   Suicidality Paternal  Grandmother    Diabetes Mellitus I Cousin        juvenile   ADD / ADHD Half-Brother    CAD Neg Hx    Stroke Neg Hx     Social History: Reviewed social history from progress note on 10/09/2020 Social History   Socioeconomic History   Marital status: Single    Spouse name: Not on file   Number of children: Not on file   Years of education: Not on file   Highest education level: Not on file  Occupational History   Occupation: student  Tobacco Use   Smoking status: Never   Smokeless tobacco: Never  Vaping Use   Vaping Use: Never used  Substance and Sexual Activity   Alcohol use: No   Drug use: No   Sexual activity: Not on file  Other Topics Concern   Not on file  Social History Narrative   Lives in [place]    Older brother is paramedic in [employer] -> [employer] -> [employer] grad school for [employer]   A/Bs.    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[place], works remotely as Chief Financial Officer   Activity: walking regularly at work   Diet: good water, fruits, vegetables    Social Determinants of Corporate investment banker Strain: Not on [employer] Insecurity: Not on file  Transportation Needs: Not on file  Physical Activity: Not on file  Stress: Not on file  Social Connections: Not on file    Allergies:  Allergies  Allergen Reactions   Peanut-Containing Drug Products    Wellbutrin [Bupropion] Other (See Comments)    Severe headache affecting vision    Metabolic Disorder Labs: No results found for: HGBA1C, MPG No results found for: PROLACTIN Lab Results  Component Value Date   CHOL 120 10/07/2019   TRIG 49 10/07/2019   HDL 53 10/07/2019   CHOLHDL 2.3 10/07/2019   LDLCALC 53 10/07/2019   Lab Results  Component Value Date   TSH 3.85 08/01/2020   TSH 1.75 10/07/2019    Therapeutic Level Labs: No results found for: LITHIUM Lab Results  Component Value Date   VALPROATE 48.8 (L) 10/19/2020   VALPROATE 75.1 10/08/2020   No  components found for:  CBMZ  Current Medications: Current Outpatient Medications  Medication Sig Dispense Refill   albuterol (VENTOLIN HFA) 108 (90 Base) MCG/ACT inhaler Inhale 2 puffs into the lungs every 6 (six) hours as needed for wheezing. 18 g 3   amitriptyline (ELAVIL) 10 MG tablet Take 1 tablet (10 mg total) by mouth at bedtime. 30 tablet 0   cetirizine (ZYRTEC) 10 MG tablet Take 10 mg by mouth daily as needed.      Cholecalciferol (VITAMIN D3) 25 MCG (1000 UT) CAPS Take 1 capsule (1,000 Units total) by mouth daily. 30 capsule  divalproex (DEPAKOTE) 250 MG DR tablet Take 1 tablet (250 mg total) by mouth 3 (three) times daily. 90 tablet 1   vitamin B-12 (CYANOCOBALAMIN) 500 MCG tablet Take 1 tablet (500 mcg total) by mouth daily.     No current facility-administered medications for this visit.     Musculoskeletal: Strength & Muscle Tone:  UTA Gait & Station:  Seated Patient leans: N/A  Psychiatric Specialty Exam: Review of Systems  Psychiatric/Behavioral:         Anxious, irritable  All other systems reviewed and are negative.  There were no vitals taken for this visit.There is no height or weight on file to calculate BMI.  General Appearance: Casual  Eye Contact:  Fair  Speech:  Clear and Coherent  Volume:  Normal  Mood:  Anxious and Irritable  Affect:  Appropriate  Thought Process:  Goal Directed and Descriptions of Associations: Intact  Orientation:  Full (Time, Place, and Person)  Thought Content: Logical   Suicidal Thoughts:  No  Homicidal Thoughts:  No  Memory:  Immediate;   Fair Recent;   Fair Remote;   Fair  Judgement:  Fair  Insight:  Fair  Psychomotor Activity:  Normal  Concentration:  Concentration: Fair and Attention Span: Fair  Recall:  [employer] of Knowledge: Fair  Language: Fair  Akathisia:  No  Handed:  Right  AIMS (if indicated): done  Assets:  Communication Skills Desire for Improvement Financial  Resources/Insurance Housing Intimacy Social Support Talents/Skills Transportation Vocational/Educational  ADL's:  Intact  Cognition: WNL  Sleep:  Fair   Screenings: GAD-7    Flowsheet Row Video Visit from 10/09/2020 in [facility] Office Visit from 10/08/2020 in [facility] Visit from 07/25/2020 in [facility] Video Visit from 02/07/2020 in [facility] Office Visit from 10/07/2019 in [facility]  Total GAD-7 Score 16 14 14 14 11       PHQ2-9    Flowsheet Row Video Visit from 10/09/2020 in [facility] Office Visit from 10/08/2020 in [facility] Visit from 07/25/2020 in [facility] Video Visit from 02/07/2020 in [facility] Tele[facility] Office Visit from 10/07/2019 in [facility] Total Score 0 2 2 6  0  PHQ-9 Total Score -- 10 11 21 10       Flowsheet Row Video Visit from 10/09/2020 in [facility] Video Visit from 02/07/2020 in [facility] Telehealth  C-SSRS RISK CATEGORY No Risk Error: Q7 should not be populated when Q6 is No        Assessment and Plan: [name] [name] is a 25 year old Caucasian male, employed, lives with his partner in [place], has a history of bipolar type II, GAD was evaluated by telemedicine today.  Patient continues to struggle with mood swings, irritability and will benefit from the following plan.  Plan Bipolar type II mixed features most recent episode hypomanic-unstable Increase Depakote to 250 mg p.o. 3 times daily. Reviewed and discussed most recent Depakote level-subtherapeutic at 48.8.   GAD-unstable Elavil 10 mg p.o. nightly Will refer patient for CBT-patient provided information for insight counseling services-provided phone #[phone]  High risk medication use-reviewed and discussed Depakote level as  noted above. Will order Depakote level today-patient advised to go to [facility] 5 days after starting the new dose.  At risk for prolonged QT syndrome-reviewed and discussed EKG dated 10/15/2020-normal sinus rhythm.  Follow-up in clinic  in 3 weeks or sooner if needed.  This note was generated in part or whole with voice recognition software. Voice recognition is usually quite accurate but there are transcription errors that can and very often do occur. I apologize for any typographical errors that were not detected and corrected.     Jomarie Longs, MD 10/24/2020, 9:38 AM

## 2020-10-25 ENCOUNTER — Other Ambulatory Visit: Payer: Self-pay | Admitting: Family Medicine

## 2020-10-25 DIAGNOSIS — F322 Major depressive disorder, single episode, severe without psychotic features: Secondary | ICD-10-CM

## 2020-10-25 DIAGNOSIS — F419 Anxiety disorder, unspecified: Secondary | ICD-10-CM

## 2020-10-25 DIAGNOSIS — F411 Generalized anxiety disorder: Secondary | ICD-10-CM

## 2020-11-02 DIAGNOSIS — Z79899 Other long term (current) drug therapy: Secondary | ICD-10-CM | POA: Diagnosis not present

## 2020-11-02 DIAGNOSIS — F3181 Bipolar II disorder: Secondary | ICD-10-CM | POA: Diagnosis not present

## 2020-11-03 LAB — VALPROIC ACID LEVEL: Valproic Acid Lvl: 120 ug/mL — ABNORMAL HIGH (ref 50–100)

## 2020-11-05 ENCOUNTER — Telehealth: Payer: Self-pay | Admitting: Psychiatry

## 2020-11-05 DIAGNOSIS — Z79899 Other long term (current) drug therapy: Secondary | ICD-10-CM

## 2020-11-05 DIAGNOSIS — F3181 Bipolar II disorder: Secondary | ICD-10-CM

## 2020-11-05 DIAGNOSIS — F411 Generalized anxiety disorder: Secondary | ICD-10-CM

## 2020-11-05 MED ORDER — DIVALPROEX SODIUM 250 MG PO DR TAB: 250.0000 mg | DELAYED_RELEASE_TABLET | Freq: Two times a day (BID) | ORAL | 1 refills | Status: DC

## 2020-11-05 NOTE — Telephone Encounter (Signed)
We will change Depakote dosage in the system.  As discussed with patient.

## 2020-11-05 NOTE — Telephone Encounter (Signed)
Attempted to contact patient to discuss Depakote level-being high.  Left voicemail.

## 2020-11-05 NOTE — Telephone Encounter (Signed)
Contacted patient, discussed Depakote level being high.  Advised patient to hold Depakote for 4 days.  He could go back on Depakote twice a day after that if he does not have any adverse side effects.  However after being on the twice a day dosage advised patient to go to lab 4 days after taking the medication for Depakote labs to be drawn again.  Will order Depakote levels.  He will go to [facility].

## 2020-11-12 ENCOUNTER — Encounter: Payer: Self-pay | Admitting: Psychiatry

## 2020-11-12 ENCOUNTER — Telehealth (INDEPENDENT_AMBULATORY_CARE_PROVIDER_SITE_OTHER): Payer: BC Managed Care – PPO | Admitting: Psychiatry

## 2020-11-12 ENCOUNTER — Other Ambulatory Visit: Payer: Self-pay

## 2020-11-12 DIAGNOSIS — F3181 Bipolar II disorder: Secondary | ICD-10-CM

## 2020-11-12 DIAGNOSIS — F411 Generalized anxiety disorder: Secondary | ICD-10-CM | POA: Diagnosis not present

## 2020-11-12 DIAGNOSIS — Z79899 Other long term (current) drug therapy: Secondary | ICD-10-CM | POA: Diagnosis not present

## 2020-11-12 MED ORDER — AMITRIPTYLINE HCL 10 MG PO TABS: 20.0000 mg | ORAL_TABLET | Freq: Every day | ORAL | 1 refills | Status: DC

## 2020-11-12 NOTE — Progress Notes (Signed)
Virtual Visit via Video Note  I connected with [name] [name] on 11/12/20 at  3:00 PM EDT by a video enabled telemedicine application and verified that I am speaking with the correct person using two identifiers.  Location Provider Location : [facility] Patient Location : Home  Participants: Patient , Provider    I discussed the limitations of evaluation and management by telemedicine and the availability of in person appointments. The patient expressed understanding and agreed to proceed.    I discussed the assessment and treatment plan with the patient. The patient was provided an opportunity to ask questions and all were answered. The patient agreed with the plan and demonstrated an understanding of the instructions.   The patient was advised to call back or seek an in-person evaluation if the symptoms worsen or if the condition fails to improve as anticipated.    BH MD OP Progress Note  11/12/2020 3:26 PM [name] [name]  MRN:  [id number]  Chief Complaint:  Chief Complaint   Follow-up; Anxiety; Depression    HPI: [name] [name] is a 25 year old Caucasian male, employed, lives in [place], has a history of bipolar type II, GAD, asthma was evaluated by telemedicine today.  Patient today reports he is currently struggling with sleep.  He has difficulty maintaining sleep, sleep is interrupted throughout the night.  He does not feel rested when he wakes up in the morning.  The Elavil does help to some extent and is interested in increasing the dosage.  Denies side effects.  He was able to reduce the dosage of Depakote as discussed since his levels were high.  Patient reports since he quit taking it for a couple of days he could feel his mood getting worse during that time however he was able to manage okay.  Patient reports he is currently back on the twice a day dosage of 250 mg and that has been helpful so far.  Denies any significant irritability or mood swings.  Patient denies any  suicidality, homicidality or perceptual disturbances.  Patient reports work is going well.  Patient reports his relationship with his girlfriend is going well.  Patient denies any other concerns today.  Visit Diagnosis:    ICD-10-CM   1. Bipolar 2 disorder (HCC)  F31.81    mixed,most recent hypomanic    2. GAD (generalized anxiety disorder)  F41.1 amitriptyline (ELAVIL) 10 MG tablet    3. High risk medication use  Z79.899 Amitriptyline level      Past Psychiatric History: Reviewed past psychiatric history from progress note on 10/09/2020.  Past trials of sertraline, Prozac, BuSpar, bupropion, Celexa, Abilify.  Patient had neuropsychological testing done-May 21, 2020-diagnosed with bipolar type II, GAD.  Patient was under the care of therapist Mr. Delight Ovens.  Past Medical History:  Past Medical History:  Diagnosis Date   Asthma    Deformity, chest wall, congenital    Environmental allergies    mold,pine trees   History of meningitis infancy   OSA (obstructive sleep apnea)    Seasonal allergies     Past Surgical History:  Procedure Laterality Date   TONSILLECTOMY AND ADENOIDECTOMY  2001   ?growing back    Family Psychiatric History: Reviewed family psychiatric history from progress note on 10/09/2020  Family History:  Family History  Problem Relation Age of Onset   Anxiety disorder Mother    Depression Mother    Hypertension Mother    Arthritis Mother        osteo   Depression Father  Anxiety disorder Father    Hypertension Father    Asthma Brother    Cancer Maternal Aunt        ovarian   Arthritis/Rheumatoid Maternal Aunt    Cancer Maternal Uncle        lung   Diabetes Maternal Uncle    Hyperlipidemia Maternal Uncle    Diabetes Paternal Uncle    Cancer Maternal Grandfather        lung   Heart disease Maternal Grandfather    Cancer Maternal Grandmother        uterine   Hyperlipidemia Maternal Grandmother    Cancer Paternal Grandfather        penile    Suicidality Paternal Grandmother    Diabetes Mellitus I Cousin        juvenile   ADD / ADHD Half-Brother    CAD Neg Hx    Stroke Neg Hx     Social History: Reviewed social history from progress note on 10/09/2020 Social History   Socioeconomic History   Marital status: Single    Spouse name: Not on file   Number of children: Not on file   Years of education: Not on file   Highest education level: Not on file  Occupational History   Occupation: student  Tobacco Use   Smoking status: Never   Smokeless tobacco: Never  Vaping Use   Vaping Use: Never used  Substance and Sexual Activity   Alcohol use: No   Drug use: No   Sexual activity: Not on file  Other Topics Concern   Not on file  Social History Narrative   Lives in [place]    Older brother is paramedic in [employer] -> [employer] -> [employer] grad school for [employer]   A/Bs.    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[place], works remotely as Chief Financial Officer   Activity: walking regularly at work   Diet: good water, fruits, vegetables    Social Determinants of Corporate investment banker Strain: Not on [employer] Insecurity: Not on file  Transportation Needs: Not on file  Physical Activity: Not on file  Stress: Not on file  Social Connections: Not on file    Allergies:  Allergies  Allergen Reactions   Peanut-Containing Drug Products    Wellbutrin [Bupropion] Other (See Comments)    Severe headache affecting vision    Metabolic Disorder Labs: No results found for: HGBA1C, MPG No results found for: PROLACTIN Lab Results  Component Value Date   CHOL 120 10/07/2019   TRIG 49 10/07/2019   HDL 53 10/07/2019   CHOLHDL 2.3 10/07/2019   LDLCALC 53 10/07/2019   Lab Results  Component Value Date   TSH 3.85 08/01/2020   TSH 1.75 10/07/2019    Therapeutic Level Labs: No results found for: LITHIUM Lab Results  Component Value Date   VALPROATE 120 (H) 11/02/2020   VALPROATE 48.8 (L)  10/19/2020   No components found for:  CBMZ  Current Medications: Current Outpatient Medications  Medication Sig Dispense Refill   albuterol (VENTOLIN HFA) 108 (90 Base) MCG/ACT inhaler Inhale 2 puffs into the lungs every 6 (six) hours as needed for wheezing. 18 g 3   amitriptyline (ELAVIL) 10 MG tablet Take 2 tablets (20 mg total) by mouth at bedtime. 60 tablet 1   cetirizine (ZYRTEC) 10 MG tablet Take 10 mg by mouth daily as needed.      Cholecalciferol (VITAMIN D3) 25 MCG (1000 UT)  CAPS Take 1 capsule (1,000 Units total) by mouth daily. 30 capsule    divalproex (DEPAKOTE) 250 MG DR tablet Take 1 tablet (250 mg total) by mouth 2 (two) times daily. Dose change 90 tablet 1   vitamin B-12 (CYANOCOBALAMIN) 500 MCG tablet Take 1 tablet (500 mcg total) by mouth daily.     No current facility-administered medications for this visit.     Musculoskeletal: Strength & Muscle Tone:  UTA Gait & Station:  Seated Patient leans: N/A  Psychiatric Specialty Exam: Review of Systems  Psychiatric/Behavioral:  Positive for sleep disturbance. The patient is nervous/anxious.   All other systems reviewed and are negative.  There were no vitals taken for this visit.There is no height or weight on file to calculate BMI.  General Appearance: Casual  Eye Contact:  Fair  Speech:  Clear and Coherent  Volume:  Normal  Mood:  Anxious  Affect:  Congruent  Thought Process:  Goal Directed and Descriptions of Associations: Intact  Orientation:  Full (Time, Place, and Person)  Thought Content: Logical   Suicidal Thoughts:  No  Homicidal Thoughts:  No  Memory:  Immediate;   Fair Recent;   Fair Remote;   Fair  Judgement:  Fair  Insight:  Fair  Psychomotor Activity:  Normal  Concentration:  Concentration: Good and Attention Span: Good  Recall:  Good  Fund of Knowledge: Good  Language: Fair  Akathisia:  No  Handed:  Right  AIMS (if indicated): done, 0  Assets:  Communication Skills Desire for  Improvement Housing Intimacy Social Support Talents/Skills Transportation Vocational/Educational  ADL's:  Intact  Cognition: WNL  Sleep:  Poor   Screenings: GAD-7    Flowsheet Row Video Visit from 10/09/2020 in [facility] Office Visit from 10/08/2020 in [facility] Visit from 07/25/2020 in [facility] Video Visit from 02/07/2020 in [facility] Office Visit from 10/07/2019 in [facility]  Total GAD-7 Score 16 14 14 14 11       PHQ2-9    Flowsheet Row Video Visit from 10/09/2020 in [facility] Office Visit from 10/08/2020 in [facility] Visit from 07/25/2020 in [facility] Video Visit from 02/07/2020 in [facility] Office Visit from 10/07/2019 in [facility] Total Score 0 2 2 6  0  PHQ-9 Total Score -- 10 11 21 10       Flowsheet Row Video Visit from 10/09/2020 in [facility] Video Visit from 02/07/2020 in [facility] Telehealth  C-SSRS RISK CATEGORY No Risk Error: Q7 should not be populated when Q6 is No        Assessment and Plan: [name] [name] is a 25 year old Caucasian male, employed, lives with his partner in [place], has a history of bipolar type II, GAD was evaluated by telemedicine today.  Patient struggles with sleep, will benefit from the following plan.  Plan Bipolar disorder type II mixed features, most recent hypomanic-in partial remission Depakote 250 mg p.o. twice daily. Reviewed and discussed Depakote level, high-120 - 11/02/2020.  Patient was contacted by phone and was advised to reduce the dosage back to twice a day after stopping it for a few days.  Patient was advised to go back to lab to get levels done again, still pending.  He reports he has upcoming appointment to get labs done.  Encouraged to do  so.   GAD-unstable Increase  Elavil to 20 mg p.o. nightly Discussed drug to drug interaction with amitriptyline/Elavil and valproate products, valproate Products can increase the level of amitriptyline. Provided education. Patient was referred for CBT-pending  High risk medication use-as discussed above discussed Depakote level being high-120.  Patient is currently on a lower dosage-repeat levels pending Will order amitriptyline level-patient to go to [facility] in 10 days. Will monitor for side effects.  Follow-up in clinic in 4 weeks or sooner if needed.  This note was generated in part or whole with voice recognition software. Voice recognition is usually quite accurate but there are transcription errors that can and very often do occur. I apologize for any typographical errors that were not detected and corrected.     [name] Longs, MD 11/13/2020, 9:04 AM

## 2020-11-21 DIAGNOSIS — Z79899 Other long term (current) drug therapy: Secondary | ICD-10-CM | POA: Diagnosis not present

## 2020-11-21 DIAGNOSIS — F3181 Bipolar II disorder: Secondary | ICD-10-CM | POA: Diagnosis not present

## 2020-11-22 ENCOUNTER — Other Ambulatory Visit: Payer: Self-pay | Admitting: Family Medicine

## 2020-11-22 DIAGNOSIS — F3181 Bipolar II disorder: Secondary | ICD-10-CM

## 2020-11-22 DIAGNOSIS — F411 Generalized anxiety disorder: Secondary | ICD-10-CM

## 2020-11-22 LAB — VALPROIC ACID LEVEL: Valproic Acid Lvl: 38 ug/mL — ABNORMAL LOW (ref 50–100)

## 2020-11-22 NOTE — Telephone Encounter (Signed)
Prescribed by psych

## 2020-11-24 LAB — AMITRIPTYLINE LEVEL
Amitriptyline: 20 ng/mL
Nortriptyline Lvl: <20 ng/mL
Total (Ami+Nor): <40 ng/mL — ABNORMAL LOW (ref 80–200)

## 2020-12-10 ENCOUNTER — Telehealth: Payer: BC Managed Care – PPO | Admitting: Psychiatry

## 2020-12-25 ENCOUNTER — Telehealth: Payer: BC Managed Care – PPO | Admitting: Psychiatry

## 2020-12-25 ENCOUNTER — Other Ambulatory Visit: Payer: Self-pay

## 2020-12-25 ENCOUNTER — Encounter: Payer: Self-pay | Admitting: Psychiatry

## 2020-12-25 DIAGNOSIS — F411 Generalized anxiety disorder: Secondary | ICD-10-CM | POA: Diagnosis not present

## 2020-12-25 DIAGNOSIS — F3178 Bipolar disorder, in full remission, most recent episode mixed: Secondary | ICD-10-CM | POA: Diagnosis not present

## 2020-12-25 DIAGNOSIS — Z79899 Other long term (current) drug therapy: Secondary | ICD-10-CM

## 2020-12-25 NOTE — Progress Notes (Signed)
Virtual Visit via Video Note  I connected with [name] [name] on 12/25/20 at  9:20 AM EST by a video enabled telemedicine application and verified that I am speaking with the correct person using two identifiers.  Location Provider Location : [facility] Patient Location : Home  Participants: Patient , Provider    I discussed the limitations of evaluation and management by telemedicine and the availability of in person appointments. The patient expressed understanding and agreed to proceed.   I discussed the assessment and treatment plan with the patient. The patient was provided an opportunity to ask questions and all were answered. The patient agreed with the plan and demonstrated an understanding of the instructions.   The patient was advised to call back or seek an in-person evaluation if the symptoms worsen or if the condition fails to improve as anticipated.   BH MD OP Progress Note  12/25/2020 10:28 AM Cordney Barstow  MRN:  [id number]  Chief Complaint:  Chief Complaint   Follow-up; Depression    HPI: [name] [name] is a 25 year old Caucasian male, employed, lives in [place], has a history of bipolar disorder type II, GAD, asthma, was evaluated by telemedicine today.  Patient today reports he is currently doing better on the current combination of medication.  Patient is compliant on the Elavil and the Depakote.  Denies side effects.  Reports although anxious he is able to manage it better.  Therapy did help with the same.  However he has currently completed his therapy sessions .  He is currently looking for another therapist.  Does have information for insight counseling services which was provided by writer last visit.  Agrees to follow-up.  Patient reports sleep as good.  Patient denies any suicidality, homicidality or perceptual disturbances.  Patient reports work is going well.  Patient reports his girlfriend has also noticed a change in him with regards to his mood symptoms  and he is happy with his progress.  Patient denies any other concerns today.  Visit Diagnosis:    ICD-10-CM   1. Bipolar disorder, in full remission, most recent episode mixed (HCC)  F31.78    Type 2    2. GAD (generalized anxiety disorder)  F41.1     3. High risk medication use  Z79.899       Past Psychiatric History: Reviewed past psychiatric history from progress note on 10/09/2020.  Past trials of sertraline, Prozac, BuSpar, bupropion, Celexa, Abilify.  Patient had neuropsychological testing done-May 21, 2020-diagnosed with bipolar type II, GAD.  Patient was under the care of therapist Mr. Delight Ovens  Past Medical History:  Past Medical History:  Diagnosis Date   Asthma    Deformity, chest wall, congenital    Environmental allergies    mold,pine trees   History of meningitis infancy   OSA (obstructive sleep apnea)    Seasonal allergies     Past Surgical History:  Procedure Laterality Date   TONSILLECTOMY AND ADENOIDECTOMY  2001   ?growing back    Family Psychiatric History: Reviewed family psychiatric history from progress note on 10/09/2020  Family History:  Family History  Problem Relation Age of Onset   Anxiety disorder Mother    Depression Mother    Hypertension Mother    Arthritis Mother        osteo   Depression Father    Anxiety disorder Father    Hypertension Father    Asthma Brother    Cancer Maternal Aunt        ovarian  Arthritis/Rheumatoid Maternal Aunt    Cancer Maternal Uncle        lung   Diabetes Maternal Uncle    Hyperlipidemia Maternal Uncle    Diabetes Paternal Uncle    Cancer Maternal Grandfather        lung   Heart disease Maternal Grandfather    Cancer Maternal Grandmother        uterine   Hyperlipidemia Maternal Grandmother    Cancer Paternal Grandfather        penile   Suicidality Paternal Grandmother    Diabetes Mellitus I Cousin        juvenile   ADD / ADHD Half-Brother    CAD Neg Hx    Stroke Neg Hx     Social  History: Reviewed social history from progress note on 10/09/2020 Social History   Socioeconomic History   Marital status: Single    Spouse name: Not on file   Number of children: Not on file   Years of education: Not on file   Highest education level: Not on file  Occupational History   Occupation: student  Tobacco Use   Smoking status: Never   Smokeless tobacco: Never  Vaping Use   Vaping Use: Never used  Substance and Sexual Activity   Alcohol use: No   Drug use: No   Sexual activity: Not on file  Other Topics Concern   Not on file  Social History Narrative   Lives in [place]    Older brother is paramedic in [employer] -> [employer] -> [employer] grad school for [employer]   A/Bs.    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[place], works remotely as Chief Financial Officer   Activity: walking regularly at work   Diet: good water, fruits, vegetables    Social Determinants of Corporate investment banker Strain: Not on [employer] Insecurity: Not on file  Transportation Needs: Not on file  Physical Activity: Not on file  Stress: Not on file  Social Connections: Not on file    Allergies:  Allergies  Allergen Reactions   Peanut-Containing Drug Products    Wellbutrin [Bupropion] Other (See Comments)    Severe headache affecting vision    Metabolic Disorder Labs: No results found for: HGBA1C, MPG No results found for: PROLACTIN Lab Results  Component Value Date   CHOL 120 10/07/2019   TRIG 49 10/07/2019   HDL 53 10/07/2019   CHOLHDL 2.3 10/07/2019   LDLCALC 53 10/07/2019   Lab Results  Component Value Date   TSH 3.85 08/01/2020   TSH 1.75 10/07/2019    Therapeutic Level Labs: No results found for: LITHIUM Lab Results  Component Value Date   VALPROATE 38 (L) 11/21/2020   VALPROATE 120 (H) 11/02/2020   No components found for:  CBMZ  Current Medications: Current Outpatient Medications  Medication Sig Dispense Refill   albuterol (VENTOLIN  HFA) 108 (90 Base) MCG/ACT inhaler Inhale 2 puffs into the lungs every 6 (six) hours as needed for wheezing. 18 g 3   amitriptyline (ELAVIL) 10 MG tablet Take 2 tablets (20 mg total) by mouth at bedtime. 60 tablet 1   cetirizine (ZYRTEC) 10 MG tablet Take 10 mg by mouth daily as needed.      Cholecalciferol (VITAMIN D3) 25 MCG (1000 UT) CAPS Take 1 capsule (1,000 Units total) by mouth daily. 30 capsule    divalproex (DEPAKOTE) 250 MG DR tablet Take 1 tablet (250 mg total) by mouth  2 (two) times daily. Dose change 90 tablet 1   vitamin B-12 (CYANOCOBALAMIN) 500 MCG tablet Take 1 tablet (500 mcg total) by mouth daily.     No current facility-administered medications for this visit.     Musculoskeletal: Strength & Muscle Tone:  UTA Gait & Station:  Seated Patient leans: N/A  Psychiatric Specialty Exam: Review of Systems  Psychiatric/Behavioral:  The patient is nervous/anxious.   All other systems reviewed and are negative.  There were no vitals taken for this visit.There is no height or weight on file to calculate BMI.  General Appearance: Casual  Eye Contact:  Fair  Speech:  Clear and Coherent  Volume:  Normal  Mood:  Anxious coping better  Affect:  Congruent  Thought Process:  Goal Directed and Descriptions of Associations: Intact  Orientation:  Full (Time, Place, and Person)  Thought Content: Logical   Suicidal Thoughts:  No  Homicidal Thoughts:  No  Memory:  Immediate;   Fair Recent;   Fair Remote;   Fair  Judgement:  Fair  Insight:  Fair  Psychomotor Activity:  Normal  Concentration:  Concentration: Fair and Attention Span: Fair  Recall:  [employer] of Knowledge: Fair  Language: Fair  Akathisia:  No  Handed:  Right  AIMS (if indicated): done, 0  Assets:  Communication Skills Desire for Improvement Housing Social Support  ADL's:  Intact  Cognition: WNL  Sleep:  Fair   Screenings: GAD-7    Flowsheet Row Video [facility]09[facility]2020 [facility] Office Visit from 10/08/2020 in [facility] Visit from 07/25/2020 in [facility] Video Visit from 02/07/2020 in [facility] Office Visit from 10/07/2019 in [facility]  Total GAD-7 Score PHQ2-9    Flowsheet Row Video Visit from 10/09/2020 in [facility] Office Visit from 10/08/2020 in [facility] Visit from 07/25/2020 in [facility] Video Visit from 02/07/2020 in [facility] Office Visit from 10/07/2019 in [facility] Total Score 0 0  PHQ-9 Total Score -- Flowsheet Row Video Visit from 10/09/2020 in [facility] Video Visit from 02/07/2020 in [facility]  C-SSRS RISK CATEGORY No Risk Error: Q7 should not be populated when Q6 is No        Assessment and Plan: Nivaan Holzheimer is a 25 year old Caucasian male, employed, lives with his partner in [place], has a history of bipolar type II, GAD was evaluated by telemedicine today.  Patient is currently improving, plan as noted below.  Plan Bipolar disorder type II mixed-in remission Depakote 250 mg p.o. twice daily   GAD-improving Elavil 20 mg p.o. nightly Continue CBT-patient does have information for a therapist in the community.  Agrees to reach out  High risk medication use-reviewed and discussed labs-valproic acid level-11/21/2020-subtherapeutic at 38-since patient is currently stable will not readjust dosage Amitriptyline level-20-we will continue current medication dosage.  Follow-up in clinic in 3 months or sooner if needed.  This note was generated in part or whole with voice recognition software. Voice recognition is usually quite accurate but there are transcription errors that can and very often do occur. I apologize for any  typographical errors that were not detected and corrected.      Michaelann Gunnoe  Keshara Kiger, MD 12/25/2020, 10:28 AM

## 2021-01-19 ENCOUNTER — Other Ambulatory Visit: Payer: Self-pay | Admitting: Psychiatry

## 2021-01-19 DIAGNOSIS — F3181 Bipolar II disorder: Secondary | ICD-10-CM

## 2021-01-19 DIAGNOSIS — F411 Generalized anxiety disorder: Secondary | ICD-10-CM

## 2021-01-24 ENCOUNTER — Other Ambulatory Visit: Payer: Self-pay | Admitting: Family Medicine

## 2021-01-24 DIAGNOSIS — F419 Anxiety disorder, unspecified: Secondary | ICD-10-CM

## 2021-01-24 DIAGNOSIS — F411 Generalized anxiety disorder: Secondary | ICD-10-CM

## 2021-01-24 DIAGNOSIS — F322 Major depressive disorder, single episode, severe without psychotic features: Secondary | ICD-10-CM

## 2021-01-24 DIAGNOSIS — F5105 Insomnia due to other mental disorder: Secondary | ICD-10-CM

## 2021-02-04 ENCOUNTER — Encounter: Payer: Self-pay | Admitting: Family Medicine

## 2021-02-05 MED ORDER — ALBUTEROL SULFATE HFA 108 (90 BASE) MCG/ACT IN AERS: 2.0000 | INHALATION_SPRAY | Freq: Four times a day (QID) | RESPIRATORY_TRACT | 3 refills | Status: DC | PRN

## 2021-02-26 ENCOUNTER — Other Ambulatory Visit: Payer: Self-pay | Admitting: Family Medicine

## 2021-02-26 DIAGNOSIS — F411 Generalized anxiety disorder: Secondary | ICD-10-CM

## 2021-02-26 DIAGNOSIS — F419 Anxiety disorder, unspecified: Secondary | ICD-10-CM

## 2021-02-26 DIAGNOSIS — F322 Major depressive disorder, single episode, severe without psychotic features: Secondary | ICD-10-CM

## 2021-03-13 ENCOUNTER — Telehealth: Payer: BC Managed Care – PPO | Admitting: Psychiatry

## 2021-03-13 ENCOUNTER — Other Ambulatory Visit: Payer: Self-pay

## 2021-03-13 ENCOUNTER — Encounter: Payer: Self-pay | Admitting: Psychiatry

## 2021-03-13 DIAGNOSIS — F411 Generalized anxiety disorder: Secondary | ICD-10-CM

## 2021-03-13 DIAGNOSIS — F3178 Bipolar disorder, in full remission, most recent episode mixed: Secondary | ICD-10-CM | POA: Diagnosis not present

## 2021-03-13 MED ORDER — DIVALPROEX SODIUM 250 MG PO DR TAB: 250.0000 mg | DELAYED_RELEASE_TABLET | Freq: Two times a day (BID) | ORAL | 0 refills | Status: DC

## 2021-03-13 MED ORDER — AMITRIPTYLINE HCL 10 MG PO TABS: 20.0000 mg | ORAL_TABLET | Freq: Every day | ORAL | 0 refills | Status: DC

## 2021-03-13 NOTE — Progress Notes (Signed)
Virtual Visit via Video Note  I connected with [name] [name] on 03/13/21 at 10:00 AM EST by a video enabled telemedicine application and verified that I am speaking with the correct person using two identifiers.  Location Provider Location : [facility] Patient Location : Home  Participants: Patient , Provider    I discussed the limitations of evaluation and management by telemedicine and the availability of in person appointments. The patient expressed understanding and agreed to proceed.   I discussed the assessment and treatment plan with the patient. The patient was provided an opportunity to ask questions and all were answered. The patient agreed with the plan and demonstrated an understanding of the instructions.   The patient was advised to call back or seek an in-person evaluation if the symptoms worsen or if the condition fails to improve as anticipated.    [facility] MD OP Progress Note  03/13/2021 3:03 PM [name] [name]  MRN:  [id number]  Chief Complaint:  Chief Complaint  Patient presents with   Follow-up: 26 year old Caucasian male, history of bipolar disorder type II, GAD, presented for medication management.   HPI: [name] [name] is a 26 year old Caucasian male, employed, lives in [place], has a history of bipolar disorder type II, GAD, asthma was evaluated by telemedicine today.  Patient today reports he is currently doing well with regards to his mood.  Denies any anger issues, mood lability, sadness.  Does have some anxiety however overall he has been coping well.  Reports sleep has improved a lot.  He started working out on a daily basis and that has also helped his sleep to get better.  Patient denies any suicidality, homicidality or perceptual disturbances.  Patient is currently compliant on medications like Depakote, Elavil.  Denies side effects.  Patient denies any other concerns today.  Visit Diagnosis:    ICD-10-CM   1. Bipolar disorder, in full remission, most recent  episode mixed ([place])  F31.78    Type 2     2. GAD (generalized anxiety disorder)  F41.1 divalproex (DEPAKOTE) 250 MG DR tablet    amitriptyline (ELAVIL) 10 MG tablet      Past Psychiatric History: Reviewed past psychiatric history from progress note on 10/09/2020.  Past trials of medications like sertraline, Prozac, BuSpar, bupropion, Celexa, Abilify.  Neuropsychological testing completed-May 21, 2020-diagnosis of bipolar type II, GAD.  Patient used to be under the care of therapist-Mr. Buena Irish.  Past Medical History:  Past Medical History:  Diagnosis Date   Asthma    Deformity, chest wall, congenital    Environmental allergies    mold,pine trees   History of meningitis infancy   OSA (obstructive sleep apnea)    Seasonal allergies     Past Surgical History:  Procedure Laterality Date   TONSILLECTOMY AND ADENOIDECTOMY  2001   ?growing back    Family Psychiatric History: Reviewed family psychiatric history from progress note on 10/09/2020.  Family History:  Family History  Problem Relation Age of Onset   Anxiety disorder Mother    Depression Mother    Hypertension Mother    Arthritis Mother        osteo   Depression Father    Anxiety disorder Father    Hypertension Father    Asthma Brother    Cancer Maternal Aunt        ovarian   Arthritis/Rheumatoid Maternal Aunt    Cancer Maternal Uncle        lung   Diabetes Maternal Uncle    Hyperlipidemia  Maternal Uncle    Diabetes Paternal Uncle    Cancer Maternal Grandfather        lung   Heart disease Maternal Grandfather    Cancer Maternal Grandmother        uterine   Hyperlipidemia Maternal Grandmother    Cancer Paternal Grandfather        penile   Suicidality Paternal Grandmother    Diabetes Mellitus I Cousin        juvenile   ADD / ADHD Half-Brother    CAD Neg Hx    Stroke Neg Hx     Social History: Reviewed social history from progress note on 10/09/2020. Social History   Socioeconomic History   Marital  status: Single    Spouse name: Not on file   Number of children: Not on file   Years of education: Not on file   Highest education level: Not on file  Occupational History   Occupation: student  Tobacco Use   Smoking status: Never   Smokeless tobacco: Never  Vaping Use   Vaping Use: Never used  Substance and Sexual Activity   Alcohol use: No   Drug use: No   Sexual activity: Not on file  Other Topics Concern   Not on file  Social History Narrative   Lives in [place]    Older brother is paramedic in [place] grad school for [employer]   A/Bs.    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[place], works remotely as Network engineer   Activity: walking regularly at work   Diet: good water, fruits, vegetables    Social Determinants of Radio broadcast assistant Strain: Not on [employer] Insecurity: Not on file  Transportation Needs: Not on file  Physical Activity: Not on file  Stress: Not on file  Social Connections: Not on file    Allergies:  Allergies  Allergen Reactions   Peanut-Containing Drug Products    Wellbutrin [Bupropion] Other (See Comments)    Severe headache affecting vision    Metabolic Disorder Labs: No results found for: HGBA1C, MPG No results found for: PROLACTIN Lab Results  Component Value Date   CHOL 120 10/07/2019   TRIG 49 10/07/2019   HDL 53 10/07/2019   CHOLHDL 2.3 10/07/2019   LDLCALC 53 10/07/2019   Lab Results  Component Value Date   TSH 3.85 08/01/2020   TSH 1.75 10/07/2019    Therapeutic Level Labs: No results found for: LITHIUM Lab Results  Component Value Date   VALPROATE 38 (L) 11/21/2020   VALPROATE 120 (H) 11/02/2020   No components found for:  CBMZ  Current Medications: Current Outpatient Medications  Medication Sig Dispense Refill   albuterol (VENTOLIN HFA) 108 (90 Base) MCG/ACT inhaler Inhale 2 puffs into the lungs every 6 (six) hours as needed for wheezing. 18 g 3    amitriptyline (ELAVIL) 10 MG tablet Take 2 tablets (20 mg total) by mouth at bedtime. 180 tablet 0   cetirizine (ZYRTEC) 10 MG tablet Take 10 mg by mouth daily as needed.      Cholecalciferol (VITAMIN D3) 25 MCG (1000 UT) CAPS Take 1 capsule (1,000 Units total) by mouth daily. 30 capsule    divalproex (DEPAKOTE) 250 MG DR tablet Take 1 tablet (250 mg total) by mouth 2 (two) times daily. 180 tablet 0   vitamin B-12 (CYANOCOBALAMIN) 500 MCG tablet Take 1 tablet (500 mcg total) by mouth daily.  No current facility-administered medications for this visit.     Musculoskeletal: Strength & Muscle Tone:  UTA Gait & Station:  UTA Patient leans: N/A  Psychiatric Specialty Exam: Review of Systems  Psychiatric/Behavioral:  Negative for agitation, behavioral problems, confusion, decreased concentration, dysphoric mood, hallucinations, self-injury, sleep disturbance and suicidal ideas. The patient is not nervous/anxious and is not hyperactive.   All other systems reviewed and are negative.  There were no vitals taken for this visit.There is no height or weight on file to calculate BMI.  General Appearance: Casual  Eye Contact:  Fair  Speech:  Normal Rate  Volume:  Normal  Mood:  Euthymic  Affect:  Congruent  Thought Process:  Goal Directed and Descriptions of Associations: Intact  Orientation:  Full (Time, Place, and Person)  Thought Content: Logical   Suicidal Thoughts:  No  Homicidal Thoughts:  No  Memory:  Immediate;   Fair Recent;   Fair Remote;   Fair  Judgement:  Fair  Insight:  Fair  Psychomotor Activity:  Normal  Concentration:  Concentration: Fair and Attention Span: Fair  Recall:  [employer] of Knowledge: Fair  Language: Fair  Akathisia:  No  Handed:  Right  AIMS (if indicated): not done  Assets:  Communication Skills Desire for Improvement Housing Intimacy Social Support Talents/Skills  ADL's:  Intact  Cognition: WNL  Sleep:  Fair   Screenings: GAD-7     Flowsheet Row Video Visit from 03/13/2021 in [facility] Video Visit from 10/09/2020 in [place] Office Visit from 10/08/2020 in [place] at [facility] Visit from 07/25/2020 in [place] at [facility] Video Visit from 02/07/2020 in [place]  Total GAD-7 Score 3 16 14 14 14       PHQ2-9    Flowsheet Row Video Visit from 10/09/2020 in [place] Office Visit from 10/08/2020 in [place] at [facility] Visit from 07/25/2020 in [place] at [facility] Video Visit from 02/07/2020 in [place] [facility] Visit from 10/07/2019 in [place] at [facility] Total Score 0 2 2 6  0  PHQ-9 Total Score -- 10 11 21 10       Flowsheet Row Video Visit from 10/09/2020 in [place] Video Visit from 02/07/2020 in [place] No Risk Error: Q7 should not be populated when Q6 is No        Assessment and Plan: [name] [name] is a 26 year old Caucasian male, employed, lives with his partner in [place], has a history of bipolar disorder type II, GAD was evaluated by telemedicine today.  Patient is currently stable.  Plan as noted below.  Plan Bipolar disorder type II mixed in remission Depakote 250 mg p.o. twice daily Depakote level-dated 11/21/2020-subtherapeutic at 38.  [name] not make any medication readjustment at this time currently stable on this dosage.  GAD-stable Elavil 20 mg p.o. nightly Continue CBT as needed   Follow-up in clinic in 3 months or sooner in person.   Collaboration of Care: Collaboration of Care: Referral or follow-up with counselor/therapist AEB patient encouraged to follow up with therapist.  Patient/Guardian was advised Release of Information must be obtained prior to any record release in order to collaborate their care with an outside  provider. Patient/Guardian was advised if they have not already done so to contact the registration department to sign all necessary forms in order for [place] to release information regarding their  care.   Consent: Patient/Guardian gives verbal consent for treatment and assignment of benefits for services provided during this visit. Patient/Guardian expressed understanding and agreed to proceed.   This note was generated in part or whole with voice recognition software. Voice recognition is usually quite accurate but there are transcription errors that can and very often do occur. I apologize for any typographical errors that were not detected and corrected.      Ursula Alert, MD 03/13/2021, 3:03 PM

## 2021-03-18 ENCOUNTER — Other Ambulatory Visit: Payer: Self-pay | Admitting: Psychiatry

## 2021-03-18 DIAGNOSIS — F411 Generalized anxiety disorder: Secondary | ICD-10-CM

## 2021-03-22 ENCOUNTER — Other Ambulatory Visit: Payer: Self-pay | Admitting: Family Medicine

## 2021-03-22 DIAGNOSIS — F411 Generalized anxiety disorder: Secondary | ICD-10-CM

## 2021-03-22 DIAGNOSIS — F322 Major depressive disorder, single episode, severe without psychotic features: Secondary | ICD-10-CM

## 2021-03-22 DIAGNOSIS — F419 Anxiety disorder, unspecified: Secondary | ICD-10-CM

## 2021-06-11 ENCOUNTER — Ambulatory Visit: Payer: BC Managed Care – PPO | Admitting: Psychiatry

## 2021-06-11 ENCOUNTER — Encounter: Payer: Self-pay | Admitting: Psychiatry

## 2021-06-11 VITALS — BP 129/91 | HR 88 | Temp 98.5°F | Wt 168.0 lb

## 2021-06-11 DIAGNOSIS — Z634 Disappearance and death of family member: Secondary | ICD-10-CM

## 2021-06-11 DIAGNOSIS — F411 Generalized anxiety disorder: Secondary | ICD-10-CM | POA: Diagnosis not present

## 2021-06-11 DIAGNOSIS — Z79899 Other long term (current) drug therapy: Secondary | ICD-10-CM | POA: Diagnosis not present

## 2021-06-11 DIAGNOSIS — F3178 Bipolar disorder, in full remission, most recent episode mixed: Secondary | ICD-10-CM | POA: Diagnosis not present

## 2021-06-11 MED ORDER — DIVALPROEX SODIUM 125 MG PO DR TAB: 125.0000 mg | DELAYED_RELEASE_TABLET | Freq: Two times a day (BID) | ORAL | 1 refills | Status: DC

## 2021-06-11 MED ORDER — AMITRIPTYLINE HCL 10 MG PO TABS: 20.0000 mg | ORAL_TABLET | Freq: Every day | ORAL | 0 refills | Status: DC

## 2021-06-11 NOTE — Progress Notes (Unsigned)
[place] MD OP Progress Note  06/11/2021 4:06 PM [name] [name]  MRN:  [id number]  Chief Complaint:  Chief Complaint  Patient presents with   Follow-up: 26 year old Caucasian male with history of bipolar disorder type II, GAD, recently lost his brother, presented for medication management.   HPI: [name] [name] is a 26 year old Caucasian male, employed, lives in [place], has a history of bipolar disorder type II, GAD, asthma was evaluated in office today.  Patient reports he lost his brother on May 8, had the funeral service on May 15.  His brother was in a car accident.  Patient reports he continues to be in denial and has not had a chance to grieve yet.  Patient reports he is trying to support his parents who are struggling with grief.  He has been going to their home, to help and support at least a couple of times a week.  His older brother also came in from [place] to help.  Patient reports he follows up with his therapist on [facility] on a weekly basis and that has been beneficial.  Patient reports he wonders whether being on the Depakote could be causing him to be numb and not feel his grief as he should.  He is interested in reducing the dosage if possible.  Patient is currently compliant on the amitriptyline and is happy with the current dosage.  Denies side effects.  Reports sleep is good on the amitriptyline.  Patient denies any suicidality, homicidality or perceptual disturbances.  Patient denies any other concerns today.  Visit Diagnosis:    ICD-10-CM   1. Bipolar disorder, in full remission, most recent episode mixed (HCC)  F31.78 divalproex (DEPAKOTE) 125 MG DR tablet    Sodium    Hepatic function panel    Platelet count    Valproic acid level    2. GAD (generalized anxiety disorder)  F41.1 divalproex (DEPAKOTE) 125 MG DR tablet    amitriptyline (ELAVIL) 10 MG tablet    Sodium    Hepatic function panel    3. Bereavement  Z63.4     4. High risk medication use  Z79.899 Sodium     Hepatic function panel    Platelet count    Valproic acid level      Past Psychiatric History: Reviewed past psychiatric history from progress note on 10/09/2020.  Past trials of medications like sertraline, Prozac, BuSpar, bupropion, Celexa, Abilify.  Neuropsychological testing completed-May 21, 2020-diagnosis of bipolar type II, GAD.  Patient used to be under the care of therapist-Mr. [employer].  Past Medical History:  Past Medical History:  Diagnosis Date   Asthma    Deformity, chest wall, congenital    Environmental allergies    mold,pine trees   History of meningitis infancy   OSA (obstructive sleep apnea)    Seasonal allergies     Past Surgical History:  Procedure Laterality Date   TONSILLECTOMY AND ADENOIDECTOMY  2001   ?growing back    Family Psychiatric History: Reviewed family psychiatric history from progress note on 10/09/2020.  Family History:  Family History  Problem Relation Age of Onset   Anxiety disorder Mother    Depression Mother    Hypertension Mother    Arthritis Mother        osteo   Depression Father    Anxiety disorder Father    Hypertension Father    Asthma Brother    Cancer Maternal Aunt        ovarian   Arthritis/Rheumatoid Maternal Aunt  Cancer Maternal Uncle        lung   Diabetes Maternal Uncle    Hyperlipidemia Maternal Uncle    Diabetes Paternal Uncle    Cancer Maternal Grandfather        lung   Heart disease Maternal Grandfather    Cancer Maternal Grandmother        uterine   Hyperlipidemia Maternal Grandmother    Cancer Paternal Grandfather        penile   Suicidality Paternal Grandmother    Diabetes Mellitus I Cousin        juvenile   ADD / ADHD Half-Brother    CAD Neg Hx    Stroke Neg Hx     Social History: Reviewed social history from progress note on 10/09/2020. Social History   Socioeconomic History   Marital status: Single    Spouse name: Not on file   Number of children: Not on file   Years of  education: Not on file   Highest education level: Not on file  Occupational History   Occupation: student  Tobacco Use   Smoking status: Never   Smokeless tobacco: Never  Vaping Use   Vaping Use: Never used  Substance and Sexual Activity   Alcohol use: No   Drug use: No   Sexual activity: Not on file  Other Topics Concern   Not on file  Social History Narrative   Lives in [place]    Older brother is paramedic in [place] grad school for [employer]   A/Bs.    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[place], works remotely as Network engineer   Activity: walking regularly at work   Diet: good water, fruits, vegetables    Social Determinants of Radio broadcast assistant Strain: Not on [employer] Insecurity: Not on file  Transportation Needs: Not on file  Physical Activity: Not on file  Stress: Not on file  Social Connections: Not on file    Allergies:  Allergies  Allergen Reactions   Peanut-Containing Drug Products    Wellbutrin [Bupropion] Other (See Comments)    Severe headache affecting vision    Metabolic Disorder Labs: No results found for: HGBA1C, MPG No results found for: PROLACTIN Lab Results  Component Value Date   CHOL 120 10/07/2019   TRIG 49 10/07/2019   HDL 53 10/07/2019   CHOLHDL 2.3 10/07/2019   LDLCALC 53 10/07/2019   Lab Results  Component Value Date   TSH 3.85 08/01/2020   TSH 1.75 10/07/2019    Therapeutic Level Labs: No results found for: LITHIUM Lab Results  Component Value Date   VALPROATE 38 (L) 11/21/2020   VALPROATE 120 (H) 11/02/2020   No components found for:  CBMZ  Current Medications: Current Outpatient Medications  Medication Sig Dispense Refill   divalproex (DEPAKOTE) 125 MG DR tablet Take 1 tablet (125 mg total) by mouth 2 (two) times daily. 60 tablet 1   albuterol (VENTOLIN HFA) 108 (90 Base) MCG/ACT inhaler Inhale 2 puffs into the lungs every 6 (six) hours as needed  for wheezing. 18 g 3   amitriptyline (ELAVIL) 10 MG tablet Take 2 tablets (20 mg total) by mouth at bedtime. 180 tablet 0   cetirizine (ZYRTEC) 10 MG tablet Take 10 mg by mouth daily as needed.      Cholecalciferol (VITAMIN D3) 25 MCG (1000 UT) CAPS Take 1 capsule (1,000 Units total) by mouth daily. 30 capsule  vitamin B-12 (CYANOCOBALAMIN) 500 MCG tablet Take 1 tablet (500 mcg total) by mouth daily.     No current facility-administered medications for this visit.     Musculoskeletal: Strength & Muscle Tone: within normal limits Gait & Station: normal Patient leans: N/A  Psychiatric Specialty Exam: Review of Systems  Psychiatric/Behavioral:         Grieving  All other systems reviewed and are negative.  There were no vitals taken for this visit.There is no height or weight on file to calculate BMI.  General Appearance: Casual  Eye Contact:  Fair  Speech:  Clear and Coherent  Volume:  Normal  Mood:   Grieving  Affect:  Appropriate  Thought Process:  Goal Directed and Descriptions of Associations: Intact  Orientation:  Full (Time, Place, and Person)  Thought Content: Logical   Suicidal Thoughts:  No  Homicidal Thoughts:  No  Memory:  Immediate;   Fair Recent;   Fair Remote;   Fair  Judgement:  Fair  Insight:  Fair  Psychomotor Activity:  Normal  Concentration:  Concentration: Fair and Attention Span: Fair  Recall:  [employer] of Knowledge: Fair  Language: Fair  Akathisia:  No  Handed:  Right  AIMS (if indicated): done  Assets:  Communication Skills Desire for Improvement Housing Social Support  ADL's:  Intact  Cognition: WNL  Sleep:  Fair   Screenings: GAD-7    Flowsheet Row Video Visit from 03/13/2021 in [place] Video Visit from 10/09/2020 in [place] Office Visit from 10/08/2020 in [place] at [facility] Visit from 07/25/2020 in [place] at [facility] Video Visit from  02/07/2020 in [place]  Total GAD-7 Score 3 16 14 14 14       PHQ2-9    Flowsheet Row Video Visit from 10/09/2020 in [place] Office Visit from 10/08/2020 in [place] at [facility] from 07/25/2020 in [place] at [facility] Video Visit from 02/07/2020 in [place] Visit from 10/07/2019 in [place] at [facility] Total Score 0 2 2 6  0  PHQ-9 Total Score -- 10 11 21 10       Flowsheet Row Video Visit from 10/09/2020 in [place] Video Visit from 02/07/2020 in [place] No Risk Error: Q7 should not be populated when Q6 is No        Assessment and Plan: Jahmeer Melrose is a 26 year old Caucasian male, employed, lives with his partner in [place], has a history of bipolar disorder type II, GAD, recently lost his brother 3 weeks ago presented for follow-up appointment.  Patient is currently grieving, as well as reports possible side effects to Depakote, will benefit from the following plan.  Plan Bipolar disorder type II mixed in remission Will reduce Depakote to 125 mg p.o. twice daily Depakote level-11/21/2020-subtherapeutic at 38.  GAD-stable Elavil 20 mg p.o. nightly   Bereavement-unstable Patient to continue psychotherapy with this therapist- [facility].  High risk medication use-will order Depakote level, sodium, platelet, LFT.  Patient to go to [facility] after 5 days of starting the lower dosage.   Provided information for [facility]-hospice-grief therapy-patient wants his family to get help.  Follow-up in clinic in 4 weeks or sooner if needed.  This note was generated in part or whole with voice recognition software. Voice recognition is usually quite accurate but there are transcription errors that can and very often  do occur. I apologize for any typographical errors that were not detected and  corrected.    Ursula Alert, MD 06/11/2021, 4:06 PM

## 2021-06-11 NOTE — Patient Instructions (Addendum)
[facility] for grief therapy  [facility] [address] [place], [place] [address] [phone]  [facility] [address] [place], [place] [address] [phone]    Managing Loss, Adult People experience loss in many different ways throughout their lives. Events such as moving, changing jobs, and losing friends can create a sense of loss. The loss may be as serious as a major health change, divorce, death of a pet, or death of a loved one. All of these types of loss are likely to create a physical and emotional reaction known as grief. Grief is the result of a major change or an absence of something or someone that you count on. Grief is a normal reaction to loss. A variety of factors can affect your grieving experience, including: The nature of your loss. Your relationship to what or whom you lost. Your understanding of grief and how to manage it. Your support system. Be aware that when grief becomes extreme, it can lead to more severe issues like isolation, depression, anxiety, or suicidal thoughts. Talk with your health care provider if you have any of these issues. How to manage lifestyle changes Keep to your normal routine as much as possible. If you have trouble focusing or doing normal activities, it is acceptable to take some time away from your normal routine. Spend time with friends and loved ones. Eat a healthy diet, get plenty of sleep, and rest when you feel tired. How to recognize changes  The way that you deal with your grief will affect your ability to function as you normally do. When grieving, you may experience these changes: Numbness, shock, sadness, anxiety, anger, denial, and guilt. Thoughts about death. Unexpected crying. A physical sensation of emptiness in your stomach. Problems sleeping and eating. Tiredness (fatigue). Loss of interest in normal activities. Dreaming about or imagining seeing the person who died. A need to remember what or  whom you lost. Difficulty thinking about anything other than your loss for a period of time. Relief. If you have been expecting the loss for a while, you may feel a sense of relief when it happens. Follow these instructions at home: Activity Express your feelings in healthy ways, such as: Talking with others about your loss. It may be helpful to find others who have had a similar loss, such as a support group. Writing down your feelings in a journal. Doing physical activities to release stress and emotional energy. Doing creative activities like painting, sculpting, or playing or listening to music. Practicing resilience. This is the ability to recover and adjust after facing challenges. Reading some resources that encourage resilience may help you to learn ways to practice those behaviors.  General instructions Be patient with yourself and others. Allow the grieving process to happen, and remember that grieving takes time. It is likely that you may never feel completely done with some grief. You may find a way to move on while still cherishing memories and feelings about your loss. Accepting your loss is a process. It can take months or longer to adjust. Keep all follow-up visits. This is important. Where to find support To get support for managing loss: Ask your health care provider for help and recommendations, such as grief counseling or therapy. Think about joining a support group for people who are managing a loss. Where to find more information You can find more information about managing loss from: American Society of Clinical Oncology: www.cancer.net American Psychological Association: [employer] Contact a health care provider if: Your grief  is extreme and keeps getting worse. You have ongoing grief that does not improve. Your body shows symptoms of grief, such as illness. You feel depressed, anxious, or hopeless. Get help right away if: You have thoughts about hurting  yourself or others. Get help right away if you feel like you may hurt yourself or others, or have thoughts about taking your own life. Go to your nearest emergency room or: Call 911. Call the National Suicide Prevention Lifeline at 216-078-3159 or 988. This is open 24 hours a day. Text the Crisis Text Line at (317)391-2875. Summary Grief is the result of a major change or an absence of someone or something that you count on. Grief is a normal reaction to loss. The depth of grief and the period of recovery depend on the type of loss and your ability to adjust to the change and process your feelings. Processing grief requires patience and a willingness to accept your feelings and talk about your loss with people who are supportive. It is important to find resources that work for you and to realize that people experience grief differently. There is not one grieving process that works for everyone in the same way. Be aware that when grief becomes extreme, it can lead to more severe issues like isolation, depression, anxiety, or suicidal thoughts. Talk with your health care provider if you have any of these issues. This information is not intended to replace advice given to you by your health care provider. Make sure you discuss any questions you have with your health care provider. Document Revised: 08/20/2020 Document Reviewed: 08/20/2020 Elsevier Patient Education  2023 [employer].

## 2021-07-10 LAB — HEPATIC FUNCTION PANEL
ALT: 14 IU/L (ref 0–44)
AST: 11 IU/L (ref 0–40)
Albumin: 5 g/dL (ref 4.1–5.2)
Alkaline Phosphatase: 68 IU/L (ref 44–121)
Bilirubin Total: 0.9 mg/dL (ref 0.0–1.2)
Bilirubin, Direct: 0.25 mg/dL (ref 0.00–0.40)
Total Protein: 7.1 g/dL (ref 6.0–8.5)

## 2021-07-10 LAB — VALPROIC ACID LEVEL: Valproic Acid Lvl: 27 ug/mL — ABNORMAL LOW (ref 50–100)

## 2021-07-10 LAB — SODIUM: Sodium: 143 mmol/L (ref 134–144)

## 2021-07-10 LAB — PLATELET COUNT: Platelets: 350 10*3/uL (ref 150–450)

## 2021-07-11 ENCOUNTER — Encounter: Payer: Self-pay | Admitting: Psychiatry

## 2021-07-11 ENCOUNTER — Telehealth: Payer: BC Managed Care – PPO | Admitting: Psychiatry

## 2021-07-11 DIAGNOSIS — F411 Generalized anxiety disorder: Secondary | ICD-10-CM

## 2021-07-11 DIAGNOSIS — F3178 Bipolar disorder, in full remission, most recent episode mixed: Secondary | ICD-10-CM | POA: Diagnosis not present

## 2021-07-11 DIAGNOSIS — Z79899 Other long term (current) drug therapy: Secondary | ICD-10-CM | POA: Diagnosis not present

## 2021-07-11 DIAGNOSIS — Z634 Disappearance and death of family member: Secondary | ICD-10-CM | POA: Diagnosis not present

## 2021-07-11 MED ORDER — DIVALPROEX SODIUM 125 MG PO DR TAB: 125.0000 mg | DELAYED_RELEASE_TABLET | Freq: Two times a day (BID) | ORAL | 0 refills | Status: DC

## 2021-07-11 NOTE — Progress Notes (Signed)
Virtual Visit via Video Note  I connected with [name] [name] on 07/11/21 at  9:30 AM EDT by a video enabled telemedicine application and verified that I am speaking with the correct person using two identifiers.  Location Provider Location : [facility] Patient Location : Home  Participants: Patient , Provider    I discussed the limitations of evaluation and management by telemedicine and the availability of in person appointments. The patient expressed understanding and agreed to proceed.   I discussed the assessment and treatment plan with the patient. The patient was provided an opportunity to ask questions and all were answered. The patient agreed with the plan and demonstrated an understanding of the instructions.   The patient was advised to call back or seek an in-person evaluation if the symptoms worsen or if the condition fails to improve as anticipated.   [facility] MD OP Progress Note  07/11/2021 12:03 PM [name] [name]  MRN:  [id number]  Chief Complaint:  Chief Complaint  Patient presents with   Follow-up: 26 year old Caucasian male with history of bipolar disorder type II, GAD, recently lost his brother, currently grieving, presented for medication management after recent dose readjustment of Depakote due to side effects.   HPI: [name] [name] is a 26 year old Caucasian male, employed, lives in [place], has a history of bipolar disorder type II, GAD, bereavement, asthma was evaluated by telemedicine today.  Patient today reports he is coping better with his grief.  He is currently following up with a therapist-Ms. Roland Rack ( does not remember last name ) .  Patient reports he is following up with the therapist every other week or so and that has been helpful.  Reports he is tolerating the Depakote at the lower dosage better.  He does not feel numb anymore.  He also reports he has not had any significant mood swings or anger or irritability.  Patient denies any sleep problems.  Reports  appetite is good.  Denies any suicidality, homicidality or perceptual disturbances.  Currently compliant on medications.  Reports work as going well.  He is able to focus better at work since the Depakote dosage has been reduced.  Reviewed and discussed labs including Depakote level with patient.  Visit Diagnosis:    ICD-10-CM   1. Bipolar disorder, in full remission, most recent episode mixed (HCC)  F31.78 divalproex (DEPAKOTE) 125 MG DR tablet   Type 2    2. GAD (generalized anxiety disorder)  F41.1 divalproex (DEPAKOTE) 125 MG DR tablet    3. Bereavement  Z63.4     4. High risk medication use  Z79.899       Past Psychiatric History: Reviewed past psychiatric history from progress note on 10/09/2020.  Past trials of medications like sertraline, Prozac, BuSpar, bupropion, Celexa, Abilify.  Neuropsychological testing completed-May 21, 2020-diagnosis of bipolar type II, GAD.  Patient used to be under the care of therapist-Mr. Buena Irish.    Past Medical History:  Past Medical History:  Diagnosis Date   Asthma    Deformity, chest wall, congenital    Environmental allergies    mold,pine trees   History of meningitis infancy   OSA (obstructive sleep apnea)    Seasonal allergies     Past Surgical History:  Procedure Laterality Date   TONSILLECTOMY AND ADENOIDECTOMY  2001   ?growing back    Family Psychiatric History: Reviewed family psychiatric history from progress note on 10/09/2020.  Family History:  Family History  Problem Relation Age of Onset   Anxiety disorder Mother  Depression Mother    Hypertension Mother    Arthritis Mother        osteo   Depression Father    Anxiety disorder Father    Hypertension Father    Asthma Brother    Cancer Maternal Aunt        ovarian   Arthritis/Rheumatoid Maternal Aunt    Cancer Maternal Uncle        lung   Diabetes Maternal Uncle    Hyperlipidemia Maternal Uncle    Diabetes Paternal Uncle    Cancer Maternal  Grandfather        lung   Heart disease Maternal Grandfather    Cancer Maternal Grandmother        uterine   Hyperlipidemia Maternal Grandmother    Cancer Paternal Grandfather        penile   Suicidality Paternal Grandmother    Diabetes Mellitus I Cousin        juvenile   ADD / ADHD Half-Brother    CAD Neg Hx    Stroke Neg Hx     Social History: Reviewed social history from progress note on 10/09/2020. Social History   Socioeconomic History   Marital status: Single    Spouse name: Not on file   Number of children: Not on file   Years of education: Not on file   Highest education level: Not on file  Occupational History   Occupation: student  Tobacco Use   Smoking status: Never   Smokeless tobacco: Never  Vaping Use   Vaping Use: Never used  Substance and Sexual Activity   Alcohol use: No   Drug use: No   Sexual activity: Not on file  Other Topics Concern   Not on file  Social History Narrative   Lives in [place]    Older brother is paramedic in [place]   [employer] -> [employer] -> [employer] grad school for [employer]   A/Bs.    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[place], works remotely as Chief Financial Officer   Activity: walking regularly at work   Diet: good water, fruits, vegetables    Social Determinants of Corporate investment banker Strain: Not on [employer] Insecurity: Not on file  Transportation Needs: Not on file  Physical Activity: Not on file  Stress: Not on file  Social Connections: Not on file    Allergies:  Allergies  Allergen Reactions   Peanut-Containing Drug Products    Wellbutrin [Bupropion] Other (See Comments)    Severe headache affecting vision    Metabolic Disorder Labs: No results found for: "HGBA1C", "MPG" No results found for: "PROLACTIN" Lab Results  Component Value Date   CHOL 120 10/07/2019   TRIG 49 10/07/2019   HDL 53 10/07/2019   CHOLHDL 2.3 10/07/2019   LDLCALC 53 10/07/2019   Lab Results  Component  Value Date   TSH 3.85 08/01/2020   TSH 1.75 10/07/2019    Therapeutic Level Labs: No results found for: "LITHIUM" Lab Results  Component Value Date   VALPROATE 27 (L) 07/09/2021   VALPROATE 38 (L) 11/21/2020   No results found for: "CBMZ"  Current Medications: Current Outpatient Medications  Medication Sig Dispense Refill   albuterol (VENTOLIN HFA) 108 (90 Base) MCG/ACT inhaler Inhale 2 puffs into the lungs every 6 (six) hours as needed for wheezing. 18 g 3   amitriptyline (ELAVIL) 10 MG tablet Take 2 tablets (20 mg total) by mouth at bedtime. 180 tablet 0  cetirizine (ZYRTEC) 10 MG tablet Take 10 mg by mouth daily as needed.      Cholecalciferol (VITAMIN D3) 25 MCG (1000 UT) CAPS Take 1 capsule (1,000 Units total) by mouth daily. 30 capsule    vitamin B-12 (CYANOCOBALAMIN) 500 MCG tablet Take 1 tablet (500 mcg total) by mouth daily.     divalproex (DEPAKOTE) 125 MG DR tablet Take 1 tablet (125 mg total) by mouth 2 (two) times daily. 180 tablet 0   No current facility-administered medications for this visit.     Musculoskeletal: Strength & Muscle Tone:  UTA Gait & Station:  Seated Patient leans: N/A  Psychiatric Specialty Exam: Review of Systems  Psychiatric/Behavioral:         Grieving  All other systems reviewed and are negative.   There were no vitals taken for this visit.There is no height or weight on file to calculate BMI.  General Appearance: Casual  Eye Contact:  Fair  Speech:  Clear and Coherent  Volume:  Normal  Mood:   Grieving-improving  Affect:  Congruent  Thought Process:  Goal Directed and Descriptions of Associations: Intact  Orientation:  Full (Time, Place, and Person)  Thought Content: Logical   Suicidal Thoughts:  No  Homicidal Thoughts:  No  Memory:  Immediate;   Fair Recent;   Fair Remote;   Fair  Judgement:  Fair  Insight:  Fair  Psychomotor Activity:  Normal  Concentration:  Concentration: Fair and Attention Span: Fair  Recall:  [employer] of Knowledge: Fair  Language: Fair  Akathisia:  No  Handed:  Right  AIMS (if indicated): done  Assets:  Communication Skills Desire for Improvement Housing Social Support Talents/Skills Transportation  ADL's:  Intact  Cognition: WNL  Sleep:  Fair   Screenings: AIMS    Flowsheet Row Video Visit from 07[facility]11/2021 in [facility] Office Visit from 06/11/2021 in [facility]  AIMS Total Score 0 0      GAD-7    Flowsheet Row Video Visit from 03/13/2021 in [facility] Video Visit from 10/09/2020 in [facility] Office Visit from 10/08/2020 in [facility] Visit from 07/25/2020 in [facility] Video Visit from 02/07/2020 in [facility]  Total GAD-7 Score 3 16 14 14 14       PHQ2-9    Flowsheet Row Video Visit from 07/11/2021 in [facility] Video Visit from 10/09/2020 in [facility] Office Visit from 10/08/2020 in [facility] Visit from 07/25/2020 in [facility] Video Visit from 02/07/2020 in [facility]  PHQ-2 Total Score 1 0 2 2 6   PHQ-9 Total Score 1 -- 10 11 21       Flowsheet Row Video Visit from 07/11/2021 in [facility] Video Visit from 10/09/2020 in [facility] Video Visit from 02/07/2020 in [facility]  C-SSRS RISK CATEGORY No Risk No Risk Error: Q7 should not be populated when Q6 is No        Assessment and Plan: [name] [name] is a 26 year old Caucasian male, employed, lives with his partner in [place], has a history of bipolar disorder type II, GAD, recently grieving the loss of his brother who passed away.  Patient is currently improving on the reduced dosage of Depakote as well as is in psychotherapy which has been  beneficial.  Plan as noted below.  Plan  Bipolar disorder type II mixed in remission Depakote 125 mg p.o. twice daily at reduced dosage   GAD-stable Elavil 20 mg p.o. nightly   Bereavement-improving Continue CBT with therapist-Ms. Constance.   High risk medication use-reviewed and discussed following labs-Depakote level-27-subtherapeutic.  Patient however had side effects on higher dosages.  [name] continue the same. Hepatic function panel-within normal limits, platelet count-within normal limits, sodium-143-within normal limits.  Follow-up in clinic in 3 months or sooner if needed. Collaboration of Care: Collaboration of Care: Referral or follow-up with counselor/therapist AEB encouraged to continue psychotherapy sessions.  Patient/Guardian was advised Release of Information must be obtained prior to any record release in order to collaborate their care with an outside provider. Patient/Guardian was advised if they have not already done so to contact the registration department to sign all necessary forms in order for [place] to release information regarding their care.   Consent: Patient/Guardian gives verbal consent for treatment and assignment of benefits for services provided during this visit. Patient/Guardian expressed understanding and agreed to proceed.   This note was generated in part or whole with voice recognition software. Voice recognition is usually quite accurate but there are transcription errors that can and very often do occur. I apologize for any typographical errors that were not detected and corrected.      Ursula Alert, MD 07/11/2021, 12:03 PM

## 2021-08-02 ENCOUNTER — Telehealth: Payer: Self-pay | Admitting: Family Medicine

## 2021-08-02 NOTE — Telephone Encounter (Signed)
Called patient to reschedule 10/11/21 appointment. Left message on VM to give office a call back.

## 2021-09-07 ENCOUNTER — Other Ambulatory Visit: Payer: Self-pay | Admitting: Psychiatry

## 2021-09-07 DIAGNOSIS — F411 Generalized anxiety disorder: Secondary | ICD-10-CM

## 2021-10-11 ENCOUNTER — Encounter: Payer: BC Managed Care – PPO | Admitting: Family Medicine

## 2021-10-15 ENCOUNTER — Encounter: Payer: Self-pay | Admitting: Psychiatry

## 2021-10-15 ENCOUNTER — Telehealth (INDEPENDENT_AMBULATORY_CARE_PROVIDER_SITE_OTHER): Payer: BC Managed Care – PPO | Admitting: Psychiatry

## 2021-10-15 DIAGNOSIS — F411 Generalized anxiety disorder: Secondary | ICD-10-CM

## 2021-10-15 DIAGNOSIS — F3178 Bipolar disorder, in full remission, most recent episode mixed: Secondary | ICD-10-CM | POA: Diagnosis not present

## 2021-10-15 DIAGNOSIS — Z634 Disappearance and death of family member: Secondary | ICD-10-CM | POA: Diagnosis not present

## 2021-10-15 MED ORDER — DIVALPROEX SODIUM 125 MG PO DR TAB: 125.0000 mg | DELAYED_RELEASE_TABLET | Freq: Two times a day (BID) | ORAL | 0 refills | Status: DC

## 2021-10-15 NOTE — Progress Notes (Signed)
Virtual Visit via Video Note  I connected with [name] [name] on 10/15/21 at 10:00 AM EDT by a video enabled telemedicine application and verified that I am speaking with the correct person using two identifiers.  Location Provider Location : [facility] Patient Location : Home  Participants: Patient , Provider    I discussed the limitations of evaluation and management by telemedicine and the availability of in person appointments. The patient expressed understanding and agreed to proceed.     I discussed the assessment and treatment plan with the patient. The patient was provided an opportunity to ask questions and all were answered. The patient agreed with the plan and demonstrated an understanding of the instructions.   The patient was advised to call back or seek an in-person evaluation if the symptoms worsen or if the condition fails to improve as anticipated.  Video connection was lost at less than 50% of the duration of the visit, at which time the remainder of the visit was completed through audio only   [facility] MD OP Progress Note  10/15/2021 10:16 AM [name] [name]  MRN:  [id number]  Chief Complaint:  Chief Complaint  Patient presents with   Follow-up   Depression   HPI: [name] [name] is a 26 year old Caucasian male, employed, lives in [place], has a history of bipolar disorder type II, GAD, bereavement, asthma was evaluated by telemedicine today.  Patient today reports he is coping well with his grief.  Denies any significant mood swings.  The Depakote helps with his irritability.  Denies any significant anxiety symptoms.  Patient reports sleep as good.  Reports he is able to focus and concentrate at work.  Reports appetite is fair.  Denies any side effects to his medications including Depakote, amitriptyline.  Denies any suicidality, homicidality or perceptual disturbances.  Continues to follow-up with his therapist Ms. Constance.  Currently follows up on a monthly  basis.  Denies any other concerns today.  Visit Diagnosis:    ICD-10-CM   1. Bipolar disorder, in full remission, most recent episode mixed ([place])  F31.78    Type 2    2. GAD (generalized anxiety disorder)  F41.1 divalproex (DEPAKOTE) 125 MG DR tablet    3. Bereavement  Z63.4       Past Psychiatric History: Reviewed past psychiatric history from progress note on 10/09/2020.  Past trials of medications like sertraline, Prozac, BuSpar, bupropion, Celexa, Abilify.  Neuropsychological testing completed-May 21, 2020-diagnosis of bipolar type II, GAD.  Patient used to be under the care of therapist-Mr. Buena Irish.   Past Medical History:  Past Medical History:  Diagnosis Date   Asthma    Deformity, chest wall, congenital    Environmental allergies    mold,pine trees   History of meningitis infancy   OSA (obstructive sleep apnea)    Seasonal allergies     Past Surgical History:  Procedure Laterality Date   TONSILLECTOMY AND ADENOIDECTOMY  2001   ?growing back    Family Psychiatric History: Reviewed family psychiatric history from progress note on 10/09/2020.  Family History:  Family History  Problem Relation Age of Onset   Anxiety disorder Mother    Depression Mother    Hypertension Mother    Arthritis Mother        osteo   Depression Father    Anxiety disorder Father    Hypertension Father    Asthma Brother    Cancer Maternal Aunt        ovarian   Arthritis/Rheumatoid Maternal Aunt  Cancer Maternal Uncle        lung   Diabetes Maternal Uncle    Hyperlipidemia Maternal Uncle    Diabetes Paternal Uncle    Cancer Maternal Grandfather        lung   Heart disease Maternal Grandfather    Cancer Maternal Grandmother        uterine   Hyperlipidemia Maternal Grandmother    Cancer Paternal Grandfather        penile   Suicidality Paternal Grandmother    Diabetes Mellitus I Cousin        juvenile   ADD / ADHD Half-Brother    CAD Neg Hx    Stroke Neg Hx      Social History: Reviewed social history from progress note on 10/09/2020. Social History   Socioeconomic History   Marital status: Single    Spouse name: Not on file   Number of children: Not on file   Years of education: Not on file   Highest education level: Not on file  Occupational History   Occupation: student  Tobacco Use   Smoking status: Never   Smokeless tobacco: Never  Vaping Use   Vaping Use: Never used  Substance and Sexual Activity   Alcohol use: No   Drug use: No   Sexual activity: Not on file  Other Topics Concern   Not on file  Social History Narrative   Lives in [place]    Older brother is paramedic in [place] [employer] grad school for [employer]   A/Bs.    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[place], works remotely as Network engineer   Activity: walking regularly at work   Diet: good water, fruits, vegetables    Social Determinants of Radio broadcast assistant Strain: Not on [employer] Insecurity: Not on file  Transportation Needs: Not on file  Physical Activity: Not on file  Stress: Not on file  Social Connections: Not on file    Allergies:  Allergies  Allergen Reactions   Peanut-Containing Drug Products    Wellbutrin [Bupropion] Other (See Comments)    Severe headache affecting vision    Metabolic Disorder Labs: No results found for: "HGBA1C", "MPG" No results found for: "PROLACTIN" Lab Results  Component Value Date   CHOL 120 10/07/2019   TRIG 49 10/07/2019   HDL 53 10/07/2019   CHOLHDL 2.3 10/07/2019   LDLCALC 53 10/07/2019   Lab Results  Component Value Date   TSH 3.85 08/01/2020   TSH 1.75 10/07/2019    Therapeutic Level Labs: No results found for: "LITHIUM" Lab Results  Component Value Date   VALPROATE 27 (L) 07/09/2021   VALPROATE 38 (L) 11/21/2020   No results found for: "CBMZ"  Current Medications: Current Outpatient Medications  Medication Sig Dispense Refill    albuterol (VENTOLIN HFA) 108 (90 Base) MCG/ACT inhaler Inhale 2 puffs into the lungs every 6 (six) hours as needed for wheezing. 18 g 3   amitriptyline (ELAVIL) 10 MG tablet TAKE 2 TABLETS BY MOUTH AT BEDTIME. 180 tablet 0   cetirizine (ZYRTEC) 10 MG tablet Take 10 mg by mouth daily as needed.      Cholecalciferol (VITAMIN D3) 25 MCG (1000 UT) CAPS Take 1 capsule (1,000 Units total) by mouth daily. 30 capsule    vitamin B-12 (CYANOCOBALAMIN) 500 MCG tablet Take 1 tablet (500 mcg total) by mouth daily.     divalproex (DEPAKOTE) 125 MG DR  tablet Take 1 tablet (125 mg total) by mouth 2 (two) times daily. 180 tablet 0   No current facility-administered medications for this visit.     Musculoskeletal: Strength & Muscle Tone:  UTA Gait & Station:  UTA Patient leans: N/A  Psychiatric Specialty Exam: Review of Systems  Psychiatric/Behavioral: Negative.    All other systems reviewed and are negative.   There were no vitals taken for this visit.There is no height or weight on file to calculate BMI.  General Appearance:  UTA  Eye Contact:   UTA  Speech:  Normal Rate  Volume:  Normal  Mood:  Euthymic  Affect:   UTA  Thought Process:  Goal Directed and Descriptions of Associations: Intact  Orientation:  Full (Time, Place, and Person)  Thought Content: Logical   Suicidal Thoughts:  No  Homicidal Thoughts:  No  Memory:  Immediate;   Fair Recent;   Fair Remote;   Fair  Judgement:  Fair  Insight:  Good  Psychomotor Activity:   UTA  Concentration:  Concentration: Fair and Attention Span: Fair  Recall:  Smiley Houseman of Knowledge: Fair  Language: Fair  Akathisia:  No  Handed:  Right  AIMS (if indicated): not done  Assets:  Communication Skills Desire for Improvement Housing Intimacy Talents/Skills Transportation  ADL's:  Intact  Cognition: WNL  Sleep:  Fair   Screenings: AIMS    Flowsheet Row Video Visit from 07/11/2021 in [place] Office Visit from  06/11/2021 in [place] Total Score 0 0      GAD-7    Flowsheet Row Video Visit from 10/15/2021 in [place] Video Visit from 03/13/2021 in [place] Video Visit from 10/09/2020 in [place] Office Visit from 10/08/2020 in [facility] [place] from 07/25/2020 in [place] [facility]  Total GAD-7 Score 0 3 16 14 14       PHQ2-9    Flowsheet Row Video Visit from 10/15/2021 in [place] Video Visit from 07/11/2021 in [place] Video Visit from 10/09/2020 in [place] Visit from 10/08/2020 in [facility] [facility] [place] from 07/25/2020 in [place] [facility] Total Score 0 1 0 2 2  PHQ-9 Total Score -- 1 -- 10 11      Flowsheet Row Video Visit from 10/15/2021 in [place] Video Visit from 07/11/2021 in [place] Video Visit from 10/09/2020 in [place] No Risk No Risk No Risk        Assessment and Plan: [name] [name] is a 26 year old Caucasian male, employed, lives with his partner in [place], has a history of bipolar disorder type II, GAD, currently doing well on the current medication regimen.  Plan as noted below.  Plan Bipolar disorder type II mixed in remission Depakote 125 mg p.o. twice daily at reduced dosage. Depakote level-dated 07/09/2021-27-subtherapeutic.  Patient however had side effects on higher dosages and currently doing well on this current dosage, hence [name] not make any dosage readjustment.  GAD-stable Elavil 20 mg p.o. nightly Use CBT with therapist-Ms. Constance  Follow-up in clinic in 3 months or sooner if needed. I have spent at least 15 minutes non face  to face with patient today.   Collaboration of Care: Collaboration of Care: Referral or follow-up with counselor/therapist AEB has upcoming  appointment with therapist, encouraged to continue therapy.  Patient/Guardian was advised Release of Information must be obtained prior to any record release in order to collaborate their care with an outside provider. Patient/Guardian was advised if they have not already done so to contact the registration department to sign all necessary forms in order for [place] to release information regarding their care.   Consent: Patient/Guardian gives verbal consent for treatment and assignment of benefits for services provided during this visit. Patient/Guardian expressed understanding and agreed to proceed.   This note was generated in part or whole with voice recognition software. Voice recognition is usually quite accurate but there are transcription errors that can and very often do occur. I apologize for any typographical errors that were not detected and corrected.   Hinda Kehr, MD 10/15/2021, 3:16 PM

## 2021-11-05 ENCOUNTER — Ambulatory Visit (INDEPENDENT_AMBULATORY_CARE_PROVIDER_SITE_OTHER): Payer: BC Managed Care – PPO | Admitting: Family Medicine

## 2021-11-05 ENCOUNTER — Encounter: Payer: Self-pay | Admitting: Family Medicine

## 2021-11-05 ENCOUNTER — Telehealth: Payer: Self-pay | Admitting: Family Medicine

## 2021-11-05 VITALS — BP 134/80 | HR 91 | Temp 97.1°F | Ht 70.0 in | Wt 165.1 lb

## 2021-11-05 DIAGNOSIS — J4540 Moderate persistent asthma, uncomplicated: Secondary | ICD-10-CM

## 2021-11-05 DIAGNOSIS — Z23 Encounter for immunization: Secondary | ICD-10-CM | POA: Diagnosis not present

## 2021-11-05 DIAGNOSIS — J3089 Other allergic rhinitis: Secondary | ICD-10-CM

## 2021-11-05 DIAGNOSIS — Z634 Disappearance and death of family member: Secondary | ICD-10-CM

## 2021-11-05 DIAGNOSIS — H1013 Acute atopic conjunctivitis, bilateral: Secondary | ICD-10-CM

## 2021-11-05 DIAGNOSIS — J302 Other seasonal allergic rhinitis: Secondary | ICD-10-CM

## 2021-11-05 DIAGNOSIS — Z0001 Encounter for general adult medical examination with abnormal findings: Secondary | ICD-10-CM | POA: Diagnosis not present

## 2021-11-05 DIAGNOSIS — J3081 Allergic rhinitis due to animal (cat) (dog) hair and dander: Secondary | ICD-10-CM

## 2021-11-05 DIAGNOSIS — F3181 Bipolar II disorder: Secondary | ICD-10-CM

## 2021-11-05 MED ORDER — PULMICORT FLEXHALER 180 MCG/ACT IN AEPB: 1.0000 | INHALATION_SPRAY | Freq: Two times a day (BID) | RESPIRATORY_TRACT | 6 refills | Status: DC

## 2021-11-05 MED ORDER — MONTELUKAST SODIUM 10 MG PO TABS: 10.0000 mg | ORAL_TABLET | Freq: Every day | ORAL | 6 refills | Status: DC

## 2021-11-05 NOTE — Assessment & Plan Note (Signed)
Support provided. I was unaware of brother's passing.

## 2021-11-05 NOTE — Telephone Encounter (Signed)
Plz notify I've also refilled singulair which I'd like him to take in addition to his pulmicort inhaler. If respiratory status not improved with this, recommend f/u OV.

## 2021-11-05 NOTE — Telephone Encounter (Signed)
Spoke with pt relaying Dr. G's message. Pt verbalizes understanding.  

## 2021-11-05 NOTE — Progress Notes (Addendum)
Patient ID: Aadil Sur, male    DOB: Jul 27, 1995, 26 y.o.   MRN: [id number]  This visit was conducted in person.  BP 134/80   Pulse 91   Temp (!) 97.1 F (36.2 C) (Temporal)   Ht 5\' 10"  (1.778 m)   Wt 165 lb 2 oz (74.9 kg)   SpO2 98%   PF 380 L/min Comment: 1- 300, 2- 350, 3- 380  BMI 23.69 kg/m    CC: CPE Subjective:   HPI: Raye Slyter is a 26 y.o. male presenting on 11/05/2021 for Annual Exam   Bipolar disorder II, GAD - established with psychiatry continues depakote 125mg  BID as well as amitriptyline 20mg  nightly for sleep. He also sees therapist regularly.   Little brother suddenly passed away 2021/05/25 after MVA.   Asthma - managed with PRN albuterol. Acute worsening during [place] fires. Notes ongoing airway irritability, needing albuterol 2-3 times daily.  AIRQ score = 6 Peak flow = 380 L/min (60% predicted)  Expected for age/height = 636 L/min  Preventative: Flu shot yearly [place] 03/2019 x2 Tdap 2009, 09/2019 Seat belt use discussed  Sunscreen use discussed. No changing moles on skin.  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 Interim medical history since our last visit reviewed. Allergies and medications reviewed and updated. Outpatient Medications Prior to Visit  Medication Sig Dispense Refill   albuterol (VENTOLIN HFA) 108 (90 Base) MCG/ACT inhaler Inhale 2 puffs into the lungs every 6 (six) hours as needed for wheezing. 18 g 3   amitriptyline (ELAVIL) 10 MG tablet TAKE 2 TABLETS BY MOUTH AT BEDTIME. 180 tablet 0   cetirizine (ZYRTEC) 10 MG  tablet Take 10 mg by mouth daily as needed.      Cholecalciferol (VITAMIN D3) 25 MCG (1000 UT) CAPS Take 1 capsule (1,000 Units total) by mouth daily. 30 capsule    divalproex (DEPAKOTE) 125 MG DR tablet Take 1 tablet (125 mg total) by mouth 2 (two) times daily. 180 tablet 0   vitamin B-12 (CYANOCOBALAMIN) 500 MCG tablet Take 1 tablet (500 mcg total) by mouth daily.     No facility-administered medications prior to visit.     Per HPI unless specifically indicated in ROS section below Review of Systems  Constitutional:  Negative for activity change, appetite change, chills, fatigue, fever and unexpected weight change.  HENT:  Negative for hearing loss.   Eyes:  Negative for visual disturbance.  Respiratory:  Positive for cough, chest tightness, shortness of breath and wheezing.   Cardiovascular:  Negative for chest pain, palpitations and leg swelling.  Gastrointestinal:  Negative for abdominal distention, abdominal pain, blood in stool, constipation, diarrhea, nausea and vomiting.  Genitourinary:  Negative for difficulty urinating and hematuria.  Musculoskeletal:  Negative for arthralgias, myalgias and neck pain.  Skin:  Negative for rash.  Neurological:  Negative for dizziness, seizures, syncope and headaches.  Hematological:  Negative for adenopathy. Does not bruise/bleed easily.  Psychiatric/Behavioral:  Positive for dysphoric mood. The patient is not nervous/anxious.     Objective:  BP  134/80   Pulse 91   Temp (!) 97.1 F (36.2 C) (Temporal)   Ht 5\' 10"  (1.778 m)   Wt 165 lb 2 oz (74.9 kg)   SpO2 98%   PF 380 L/min Comment: 1- 300, 2- 350, 3- 380  BMI 23.69 kg/m   Wt Readings from Last 3 Encounters:  11/05/21 165 lb 2 oz (74.9 kg)  06/11/21 168 lb (76.2 kg)  10/08/20 153 lb 4 oz (69.5 kg)      Physical Exam Vitals and nursing note reviewed.  Constitutional:      General: He is not in acute distress.    Appearance: Normal appearance. He is well-developed. He is not  ill-appearing.  HENT:     Head: Normocephalic and atraumatic.     Right Ear: Hearing, tympanic membrane, ear canal and external ear normal.     Left Ear: Hearing, tympanic membrane, ear canal and external ear normal.  Eyes:     General: No scleral icterus.    Extraocular Movements: Extraocular movements intact.     Conjunctiva/sclera: Conjunctivae normal.     Pupils: Pupils are equal, round, and reactive to light.  Neck:     Thyroid: No thyroid mass or thyromegaly.  Cardiovascular:     Rate and Rhythm: Normal rate and regular rhythm.     Pulses: Normal pulses.          Radial pulses are 2+ on the right side and 2+ on the left side.     Heart sounds: Normal heart sounds. No murmur heard. Pulmonary:     Effort: Pulmonary effort is normal. No respiratory distress.     Breath sounds: Normal breath sounds. No wheezing, rhonchi or rales.     Comments: Lungs largely clear Abdominal:     General: Bowel sounds are normal. There is no distension.     Palpations: Abdomen is soft. There is no mass.     Tenderness: There is no abdominal tenderness. There is no guarding or rebound.     Hernia: No hernia is present.  Musculoskeletal:        General: Normal range of motion.     Cervical back: Normal range of motion and neck supple.     Right lower leg: No edema.     Left lower leg: No edema.  Lymphadenopathy:     Cervical: No cervical adenopathy.  Skin:    General: Skin is warm and dry.     Findings: No rash.  Neurological:     General: No focal deficit present.     Mental Status: He is alert and oriented to person, place, and time.  Psychiatric:        Mood and Affect: Mood normal.        Behavior: Behavior normal.        Thought Content: Thought content normal.        Judgment: Judgment normal.        Assessment & Plan:   Problem List Items Addressed This Visit     Encounter for general adult medical examination with abnormal findings - Primary (Chronic)    Preventative  protocols reviewed and updated unless pt declined. Discussed healthy diet and lifestyle.       Moderate persistent asthma    Deteriorated due to poor air quality from 10/10/20 smoke exposure a few months ago, ongoing dyspnea, wheezing and decreased exercise tolerance since then. Lungs clear today.  AIRQ score = 6 (poorly controlled asthma) Peak flow = 380 (60%  predicted).  Will start daily ICS pulmicort BID, will also start singulair.  Update if this doesn't improve asthma control.       Relevant Medications   budesonide (PULMICORT FLEXHALER) 180 MCG/ACT inhaler   montelukast (SINGULAIR) 10 MG tablet   Allergic rhinitis    Restart singulair      Seasonal and perennial allergic rhinoconjunctivitis of both eyes    Restart singulair.      Bipolar 2 disorder (HCC)    Stable period on depakote and amitriptyline, sees psychiatry and psychology regularly. Appreciate their care.       Bereavement    Support provided. I was unaware of brother's passing.       Other Visit Diagnoses     Need for influenza vaccination       Relevant Orders   Flu Vaccine QUAD 74mo+IM (Fluarix, Fluzone & Alfiuria Quad PF) (Completed)        Meds ordered this encounter  Medications   budesonide (PULMICORT FLEXHALER) 180 MCG/ACT inhaler    Sig: Inhale 1 puff into the lungs in the morning and at bedtime.    Dispense:  1 each    Refill:  6   montelukast (SINGULAIR) 10 MG tablet    Sig: Take 1 tablet (10 mg total) by mouth at bedtime.    Dispense:  30 tablet    Refill:  6   Orders Placed This Encounter  Procedures   Flu Vaccine QUAD 71mo+IM (Fluarix, Fluzone & Alfiuria Quad PF)    Patient instructions: Flu shot today  Peak flow today.  Start pulmicort steroid inhaler 1 puff twice daily. Let us know if breathing status not improved with this.  Good to see you today  Return in 1 year for next physical, sooner if needed or if ongoing difficulty controlling asthma.   Follow up  plan: Return in about 1 year (around 11/06/2022) for annual exam, prior fasting for blood work.  Eustaquio Boyden, MD

## 2021-11-05 NOTE — Assessment & Plan Note (Signed)
Restart singulair

## 2021-11-05 NOTE — Assessment & Plan Note (Addendum)
Deteriorated due to poor air quality from [place] smoke exposure a few months ago, ongoing dyspnea, wheezing and decreased exercise tolerance since then. Lungs clear today.  AIRQ score = 6 (poorly controlled asthma) Peak flow = 380 (60% predicted).  Will start daily ICS pulmicort 1109mcg BID, will also start singulair.  Update if this doesn't improve asthma control.

## 2021-11-05 NOTE — Patient Instructions (Addendum)
Flu shot today  Peak flow today.  Start pulmicort steroid inhaler 1 puff twice daily. Let us know if breathing status not improved with this.  Good to see you today Return in 1 year for next physical, sooner if needed or if ongoing difficulty controlling asthma   Health Maintenance, Male Adopting a healthy lifestyle and getting preventive care are important in promoting health and wellness. Ask your health care provider about: The right schedule for you to have regular tests and exams. Things you can do on your own to prevent diseases and keep yourself healthy. What should I know about diet, weight, and exercise? Eat a healthy diet  Eat a diet that includes plenty of vegetables, fruits, low-fat dairy products, and lean protein. Do not eat a lot of foods that are high in solid fats, added sugars, or sodium. Maintain a healthy weight Body mass index (BMI) is a measurement that can be used to identify possible weight problems. It estimates body fat based on height and weight. Your health care provider can help determine your BMI and help you achieve or maintain a healthy weight. Get regular exercise Get regular exercise. This is one of the most important things you can do for your health. Most adults should: Exercise for at least 150 minutes each week. The exercise should increase your heart rate and make you sweat (moderate-intensity exercise). Do strengthening exercises at least twice a week. This is in addition to the moderate-intensity exercise. Spend less time sitting. Even light physical activity can be beneficial. Watch cholesterol and blood lipids Have your blood tested for lipids and cholesterol at 25 years of age, then have this test every 5 years. You may need to have your cholesterol levels checked more often if: Your lipid or cholesterol levels are high. You are older than 25 years of age. You are at high risk for heart disease. What should I know about cancer screening? Many  types of cancers can be detected early and may often be prevented. Depending on your health history and family history, you may need to have cancer screening at various ages. This may include screening for: Colorectal cancer. Prostate cancer. Skin cancer. Lung cancer. What should I know about heart disease, diabetes, and high blood pressure? Blood pressure and heart disease High blood pressure causes heart disease and increases the risk of stroke. This is more likely to develop in people who have high blood pressure readings or are overweight. Talk with your health care provider about your target blood pressure readings. Have your blood pressure checked: Every 3-5 years if you are 21-44 years of age. Every year if you are 106 years old or older. If you are between the ages of 56 and 73 and are a current or former smoker, ask your health care provider if you should have a one-time screening for abdominal aortic aneurysm (AAA). Diabetes Have regular diabetes screenings. This checks your fasting blood sugar level. Have the screening done: Once every three years after age 69 if you are at a normal weight and have a low risk for diabetes. More often and at a younger age if you are overweight or have a high risk for diabetes. What should I know about preventing infection? Hepatitis B If you have a higher risk for hepatitis B, you should be screened for this virus. Talk with your health care provider to find out if you are at risk for hepatitis B infection. Hepatitis C Blood testing is recommended for: Everyone born from  1944 through 64. Anyone with known risk factors for hepatitis C. Sexually transmitted infections (STIs) You should be screened each year for STIs, including gonorrhea and chlamydia, if: You are sexually active and are younger than 25 years of age. You are older than 25 years of age and your health care provider tells you that you are at risk for this type of infection. Your  sexual activity has changed since you were last screened, and you are at increased risk for chlamydia or gonorrhea. Ask your health care provider if you are at risk. Ask your health care provider about whether you are at high risk for HIV. Your health care provider may recommend a prescription medicine to help prevent HIV infection. If you choose to take medicine to prevent HIV, you should first get tested for HIV. You should then be tested every 3 months for as long as you are taking the medicine. Follow these instructions at home: Alcohol use Do not drink alcohol if your health care provider tells you not to drink. If you drink alcohol: Limit how much you have to 0-2 drinks a day. Know how much alcohol is in your drink. In the [place] one drink equals one 12 oz bottle of beer (355 mL), one 5 oz glass of wine (148 mL), or one 1 oz glass of hard liquor (44 mL). Lifestyle Do not use any products that contain nicotine or tobacco. These products include cigarettes, chewing tobacco, and vaping devices, such as e-cigarettes. If you need help quitting, ask your health care provider. Do not use street drugs. Do not share needles. Ask your health care provider for help if you need support or information about quitting drugs. General instructions Schedule regular health, dental, and eye exams. Stay current with your vaccines. Tell your health care provider if: You often feel depressed. You have ever been abused or do not feel safe at home. Summary Adopting a healthy lifestyle and getting preventive care are important in promoting health and wellness. Follow your health care provider's instructions about healthy diet, exercising, and getting tested or screened for diseases. Follow your health care provider's instructions on monitoring your cholesterol and blood pressure. This information is not intended to replace advice given to you by your health care provider. Make sure you discuss any questions you  have with your health care provider. Document Revised: 05/21/2020 Document Reviewed: 05/21/2020 Elsevier Patient Education  [place].

## 2021-11-05 NOTE — Assessment & Plan Note (Signed)
Stable period on depakote and amitriptyline, sees psychiatry and psychology regularly. Appreciate their care.

## 2021-11-05 NOTE — Assessment & Plan Note (Signed)
Preventative protocols reviewed and updated unless pt declined. Discussed healthy diet and lifestyle.  

## 2021-12-12 ENCOUNTER — Other Ambulatory Visit: Payer: Self-pay | Admitting: Psychiatry

## 2021-12-12 DIAGNOSIS — F411 Generalized anxiety disorder: Secondary | ICD-10-CM

## 2022-01-14 ENCOUNTER — Encounter: Payer: Self-pay | Admitting: Psychiatry

## 2022-01-14 ENCOUNTER — Telehealth: Payer: BC Managed Care – PPO | Admitting: Psychiatry

## 2022-01-14 ENCOUNTER — Encounter: Payer: Self-pay | Admitting: Family Medicine

## 2022-01-14 DIAGNOSIS — F411 Generalized anxiety disorder: Secondary | ICD-10-CM | POA: Diagnosis not present

## 2022-01-14 DIAGNOSIS — F3178 Bipolar disorder, in full remission, most recent episode mixed: Secondary | ICD-10-CM | POA: Diagnosis not present

## 2022-01-14 DIAGNOSIS — Z634 Disappearance and death of family member: Secondary | ICD-10-CM

## 2022-01-14 MED ORDER — DIVALPROEX SODIUM 125 MG PO DR TAB: 125.0000 mg | DELAYED_RELEASE_TABLET | Freq: Two times a day (BID) | ORAL | 1 refills | Status: AC

## 2022-01-14 NOTE — Progress Notes (Signed)
Virtual Visit via Video Note  I connected with [name] [name] on 01/14/22 at 11:40 AM EST by a video enabled telemedicine application and verified that I am speaking with the correct person using two identifiers.  Location Provider Location : [facility] Patient Location : Home  Participants: Patient , Provider   I discussed the limitations of evaluation and management by telemedicine and the availability of in person appointments. The patient expressed understanding and agreed to proceed.   I discussed the assessment and treatment plan with the patient. The patient was provided an opportunity to ask questions and all were answered. The patient agreed with the plan and demonstrated an understanding of the instructions.   The patient was advised to call back or seek an in-person evaluation if the symptoms worsen or if the condition fails to improve as anticipated.   [facility] MD OP Progress Note  01/14/2022 2:34 PM [name] [name]  MRN:  [id number]  Chief Complaint:  Chief Complaint  Patient presents with   Follow-up   Medication Refill   mood swings   HPI: [name] [name] is a 27 year old Caucasian male, employed, lives in [place], has a history of bipolar disorder type II, GAD, bereavement, asthma, was evaluated by telemedicine today.    Patient today reports holidays went okay.  Did have moments of grief from losing his brother however was able to spend time with family including his parents.  That helped.  Reports he does have anxiety regarding his work right now.  He may lose his job because his company is going to take away his position.  Patient however reports he has been managing okay with his anxiety.  Continues to follow-up with therapist.  Currently compliant on the Depakote, amitriptyline.  Denies side effects.  Currently on a new medication for allergies, montelukast.  Reports since being on it he has had more agitation and hence stopped taking it a couple of days ago.  Would like to  follow-up with his providers regarding that.  Patient denies any suicidality, homicidality or perceptual disturbances.  Patient denies any other concerns today.  Visit Diagnosis:    ICD-10-CM   1. Bipolar disorder, in full remission, most recent episode mixed ([place])  F31.78    Type 2    2. GAD (generalized anxiety disorder)  F41.1 divalproex (DEPAKOTE) 125 MG DR tablet    3. Bereavement  Z63.4       Past Psychiatric History: Reviewed past psych history from progress note on 10/09/2020.  Past trials of medications like sertraline, Prozac, BuSpar, bupropion, Celexa, Abilify.  Neuropsychological testing completed May 21, 2020-diagnosis of bipolar type II, GAD.  Patient used to be under the care of therapist-Mr. Buena Irish.  Past Medical History:  Past Medical History:  Diagnosis Date   Asthma    Deformity, chest wall, congenital    Environmental allergies    mold,pine trees   History of meningitis infancy   OSA (obstructive sleep apnea)    Seasonal allergies     Past Surgical History:  Procedure Laterality Date   TONSILLECTOMY AND ADENOIDECTOMY  2001   ?growing back    Family Psychiatric History: Reviewed family psychiatric history from progress note on 10/09/2020.  Family History:  Family History  Problem Relation Age of Onset   Anxiety disorder Mother    Depression Mother    Hypertension Mother    Arthritis Mother        osteo   Depression Father    Anxiety disorder Father    Hypertension  Father    Asthma Brother    Cancer Maternal Aunt        ovarian   Arthritis/Rheumatoid Maternal Aunt    Cancer Maternal Uncle        lung   Diabetes Maternal Uncle    Hyperlipidemia Maternal Uncle    Diabetes Paternal Uncle    Cancer Maternal Grandfather        lung   Heart disease Maternal Grandfather    Cancer Maternal Grandmother        uterine   Hyperlipidemia Maternal Grandmother    Cancer Paternal Grandfather        penile   Suicidality Paternal Grandmother     Diabetes Mellitus I Cousin        juvenile   ADD / ADHD Half-Brother    CAD Neg Hx    Stroke Neg Hx     Social History: Reviewed social history from progress note on 10/09/2020. Social History   Socioeconomic History   Marital status: Single    Spouse name: Not on file   Number of children: Not on file   Years of education: Not on file   Highest education level: Not on file  Occupational History   Occupation: student  Tobacco Use   Smoking status: Never   Smokeless tobacco: Never  Vaping Use   Vaping Use: Never used  Substance and Sexual Activity   Alcohol use: No   Drug use: No   Sexual activity: Not on file  Other Topics Concern   Not on file  Social History Narrative   Lives in [place]    Older brother is paramedic in [place] grad school for [employer]   A/Bs.    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[place], works remotely as Network engineer   Activity: walking regularly at work   Diet: good water, fruits, vegetables    Social Determinants of Radio broadcast assistant Strain: Not on [employer] Insecurity: Not on file  Transportation Needs: Not on file  Physical Activity: Not on file  Stress: Not on file  Social Connections: Not on file    Allergies:  Allergies  Allergen Reactions   Peanut-Containing Drug Products    Wellbutrin [Bupropion] Other (See Comments)    Severe headache affecting vision    Metabolic Disorder Labs: No results found for: "HGBA1C", "MPG" No results found for: "PROLACTIN" Lab Results  Component Value Date   CHOL 120 10/07/2019   TRIG 49 10/07/2019   HDL 53 10/07/2019   CHOLHDL 2.3 10/07/2019   LDLCALC 53 10/07/2019   Lab Results  Component Value Date   TSH 3.85 08/01/2020   TSH 1.75 10/07/2019    Therapeutic Level Labs: No results found for: "LITHIUM" Lab Results  Component Value Date   VALPROATE 27 (L) 07/09/2021   VALPROATE 38 (L) 11/21/2020   No results found  for: "CBMZ"  Current Medications: Current Outpatient Medications  Medication Sig Dispense Refill   albuterol (VENTOLIN HFA) 108 (90 Base) MCG/ACT inhaler Inhale 2 puffs into the lungs every 6 (six) hours as needed for wheezing. 18 g 3   amitriptyline (ELAVIL) 10 MG tablet TAKE 2 TABLETS BY MOUTH AT BEDTIME 180 tablet 0   budesonide (PULMICORT FLEXHALER) 180 MCG/ACT inhaler Inhale 1 puff into the lungs in the morning and at bedtime. 1 each 6   cetirizine (ZYRTEC) 10 MG tablet Take 10 mg by mouth daily as needed.  Cholecalciferol (VITAMIN D3) 25 MCG (1000 UT) CAPS Take 1 capsule (1,000 Units total) by mouth daily. 30 capsule    vitamin B-12 (CYANOCOBALAMIN) 500 MCG tablet Take 1 tablet (500 mcg total) by mouth daily.     divalproex (DEPAKOTE) 125 MG DR tablet Take 1 tablet (125 mg total) by mouth 2 (two) times daily. 180 tablet 1   montelukast (SINGULAIR) 10 MG tablet Take 1 tablet (10 mg total) by mouth at bedtime. (Patient not taking: Reported on 01/14/2022) 30 tablet 6   No current facility-administered medications for this visit.     Musculoskeletal: Strength & Muscle Tone:  UTA Gait & Station:  Seated Patient leans: N/A  Psychiatric Specialty Exam: Review of Systems  Psychiatric/Behavioral:  The patient is nervous/anxious.   All other systems reviewed and are negative.   There were no vitals taken for this visit.There is no height or weight on file to calculate BMI.  General Appearance: Casual  Eye Contact:  Fair  Speech:  Clear and Coherent  Volume:  Normal  Mood:  Anxious  Affect:  Congruent  Thought Process:  Goal Directed and Descriptions of Associations: Intact  Orientation:  Full (Time, Place, and Person)  Thought Content: Logical   Suicidal Thoughts:  No  Homicidal Thoughts:  No  Memory:  Immediate;   Fair Recent;   Fair Remote;   Fair  Judgement:  Fair  Insight:  Fair  Psychomotor Activity:  Normal  Concentration:  Concentration: Fair and Attention Span:  Fair  Recall:  [employer] of Knowledge: Fair  Language: Fair  Akathisia:  No  Handed:  Right  AIMS (if indicated): not done  Assets:  Communication Skills Desire for [place] Talents/Skills Transportation  ADL's:  Intact  Cognition: WNL  Sleep:  Fair   Screenings: AIMS    Flowsheet Row Video Visit from 07/11/2021 in [place] Office Visit from 06/11/2021 in [place] Total Score 0 0      GAD-7    Flowsheet Row Video Visit from 01/14/2022 in [place] Visit from 11/05/2021 in [place] at [facility] Video Visit from 10/15/2021 in [place] Video Visit from 03/13/2021 in [place] Video Visit from 10/09/2020 in [place]  Total GAD-7 Score 9 8 0 3 16      PHQ2-9    Flowsheet Row Video Visit from 01/14/2022 in [place] Visit from 11/05/2021 in [place] at [facility] Video Visit from 10/15/2021 in [place] Video Visit from 07/11/2021 in [place] Video Visit from 10/09/2020 in [place]  PHQ-2 Total Score 0 2 0 1 0  PHQ-9 Total Score -- 10 -- 1 --      Flowsheet Row Video Visit from 01/14/2022 in [place] Video Visit from 10/15/2021 in [place] Video Visit from 07/11/2021 in [place] No Risk No Risk No Risk        Assessment and Plan: Teruo Noreen is a 27 year old Caucasian male, employed, lives with his partner in [place], has a history of bipolar disorder type II, GAD, currently reports possible side effects to Singulair which may have caused irritability as well as does report situational  anxiety.  Patient [name] benefit from following plan.  Plan Bipolar disorder type II mixed in remission Depakote 125 mg p.o. twice  daily and reduced dosage Depakote level-07/09/2021-27-subtherapeutic. Currently doing well on this dosage of Depakote, higher dosages caused side effects.  Hence [name] continue the same. Patient to talk to his provider regarding Singulair/montelukast causing agitation/irritability.  Patient currently is off of it.  GAD-stable Continue CBT with Ms. Constance. Patient with situational anxiety. Elavil 20 mg p.o. nightly  Bereavement - improving [name] monitor closely  Follow-up in clinic in 6 months or sooner in person.   Collaboration of Care: Collaboration of Care: Primary Care Provider AEB encouraged to follow up with primary care provider regarding Singulair side effect and Referral or follow-up with counselor/therapist AEB encouraged to follow up with therapist.  Patient/Guardian was advised Release of Information must be obtained prior to any record release in order to collaborate their care with an outside provider. Patient/Guardian was advised if they have not already done so to contact the registration department to sign all necessary forms in order for [place] to release information regarding their care.   Consent: Patient/Guardian gives verbal consent for treatment and assignment of benefits for services provided during this visit. Patient/Guardian expressed understanding and agreed to proceed.   This note was generated in part or whole with voice recognition software. Voice recognition is usually quite accurate but there are transcription errors that can and very often do occur. I apologize for any typographical errors that were not detected and corrected.      Ursula Alert, MD 01/14/2022, 2:34 PM

## 2022-01-17 ENCOUNTER — Other Ambulatory Visit: Payer: Self-pay | Admitting: Family Medicine

## 2022-01-17 MED ORDER — BUDESONIDE-FORMOTEROL FUMARATE 160-4.5 MCG/ACT IN AERO: 2.0000 | INHALATION_SPRAY | Freq: Two times a day (BID) | RESPIRATORY_TRACT | 6 refills | Status: DC

## 2022-01-20 NOTE — Telephone Encounter (Signed)
Message from pharmacy:  Alternative Requested:NEITHER BRAND NOR [place].  They suggest Wixela Inhub 250-50 MCG/ACT AEPP.

## 2022-01-21 NOTE — Telephone Encounter (Signed)
ERx 

## 2022-03-17 ENCOUNTER — Other Ambulatory Visit: Payer: Self-pay | Admitting: Psychiatry

## 2022-03-17 ENCOUNTER — Telehealth: Payer: BC Managed Care – PPO | Admitting: Nurse Practitioner

## 2022-03-17 DIAGNOSIS — F411 Generalized anxiety disorder: Secondary | ICD-10-CM

## 2022-03-17 DIAGNOSIS — R112 Nausea with vomiting, unspecified: Secondary | ICD-10-CM

## 2022-03-17 MED ORDER — ONDANSETRON 4 MG PO TBDP: 4.0000 mg | ORAL_TABLET | Freq: Three times a day (TID) | ORAL | 0 refills | Status: DC | PRN

## 2022-03-17 NOTE — Progress Notes (Signed)
Virtual Visit Consent   [name], you are scheduled for a virtual visit with a [facility] provider today. Just as with appointments in the office, your consent must be obtained to participate. Your consent will be active for this visit and any virtual visit you may have with one of our providers in the next 365 days. If you have a MyChart account, a copy of this consent can be sent to you electronically.  As this is a virtual visit, video technology does not allow for your provider to perform a traditional examination. This may limit your provider's ability to fully assess your condition. If your provider identifies any concerns that need to be evaluated in person or the need to arrange testing (such as labs, EKG, etc.), we will make arrangements to do so. Although advances in technology are sophisticated, we cannot ensure that it will always work on either your end or our end. If the connection with a video visit is poor, the visit may have to be switched to a telephone visit. With either a video or telephone visit, we are not always able to ensure that we have a secure connection.  By engaging in this virtual visit, you consent to the provision of healthcare and authorize for your insurance to be billed (if applicable) for the services provided during this visit. Depending on your insurance coverage, you may receive a charge related to this service.  I need to obtain your verbal consent now. Are you willing to proceed with your visit today? Atwell [facility]by has provided verbal consent on 03/17/2022 for a virtual visit (video or telephone). Apolonio Schneiders, FNP  Date: 03/17/2022 7:36 AM  Virtual Visit via Video Note   I, Apolonio Schneiders, connected with  [name] [name]  ([id number], 1995-07-10) on 03/17/22 at  7:45 AM EST by a video-enabled telemedicine application and verified that I am speaking with the correct person using two identifiers.  Location: Patient: Virtual Visit Location Patient: Home Provider:  Virtual Visit Location Provider: Home Office   I discussed the limitations of evaluation and management by telemedicine and the availability of in person appointments. The patient expressed understanding and agreed to proceed.    History of Present Illness: [name] [name] is a 27 y.o. who identifies as a male who was assigned male at birth, and is being seen today for nausea and vomiting. He started vomiting at 9pm last night associated with abdominal pain. He has also been experiencing diarrhea.   He has still this morning been unable to keep fluids down.   He denies fever body aches and chills   Last meal was hamburger made at home with family and no one else is sick    Problems:  Patient Active Problem List   Diagnosis Date Noted   Bereavement 06/11/2021   Bipolar disorder, in full remission, most recent episode mixed ([place]) 03/13/2021   Bipolar 2 disorder ([place]) 10/09/2020   High risk medication use 10/09/2020   Mood disorder ([place]) 02/07/2020   Insomnia 02/07/2020   Restlessness 10/08/2019   GAD (generalized anxiety disorder) 06/26/2015   Encounter for general adult medical examination with abnormal findings 08/20/2012   Pectus carinatum    Seasonal and perennial allergic rhinoconjunctivitis of both eyes 06/18/2012   Obstructive sleep apnea 04/19/2012   Moderate persistent asthma 04/19/2012   Allergic rhinitis 04/19/2012    Allergies:  Allergies  Allergen Reactions   Peanut-Containing Drug Products    Singulair [Montelukast] Other (See Comments)    Worsened agitation/irritability  Wellbutrin [Bupropion] Other (See Comments)    Severe headache affecting vision   Medications:  Current Outpatient Medications:    albuterol (VENTOLIN HFA) 108 (90 Base) MCG/ACT inhaler, Inhale 2 puffs into the lungs every 6 (six) hours as needed for wheezing., Disp: 18 g, Rfl: 3   amitriptyline (ELAVIL) 10 MG tablet, TAKE 2 TABLETS BY MOUTH AT BEDTIME, Disp: 180 tablet, Rfl: 0   cetirizine  (ZYRTEC) 10 MG tablet, Take 10 mg by mouth daily as needed. , Disp: , Rfl:    Cholecalciferol (VITAMIN D3) 25 MCG (1000 UT) CAPS, Take 1 capsule (1,000 Units total) by mouth daily., Disp: 30 capsule, Rfl:    divalproex (DEPAKOTE) 125 MG DR tablet, Take 1 tablet (125 mg total) by mouth 2 (two) times daily., Disp: 180 tablet, Rfl: 1   fluticasone-salmeterol (WIXELA INHUB) 250-50 MCG/ACT AEPB, Inhale 1 puff into the lungs in the morning and at bedtime., Disp: 60 each, Rfl: 5   vitamin B-12 (CYANOCOBALAMIN) 500 MCG tablet, Take 1 tablet (500 mcg total) by mouth daily., Disp: , Rfl:   Observations/Objective: Patient is well-developed, well-nourished in no acute distress.  Resting comfortably  at home.  Head is normocephalic, atraumatic.  No labored breathing.  Speech is clear and coherent with logical content.  Patient is alert and oriented at baseline.    Assessment and Plan: 1. Nausea and vomiting, unspecified vomiting type  - ondansetron (ZOFRAN-ODT) 4 MG disintegrating tablet; Take 1 tablet (4 mg total) by mouth every 8 (eight) hours as needed for nausea or vomiting.  Dispense: 20 tablet; Refill: 0     Follow Up Instructions: I discussed the assessment and treatment plan with the patient. The patient was provided an opportunity to ask questions and all were answered. The patient agreed with the plan and demonstrated an understanding of the instructions.  A copy of instructions were sent to the patient via MyChart unless otherwise noted below.    The patient was advised to call back or seek an in-person evaluation if the symptoms worsen or if the condition fails to improve as anticipated.  Time:  I spent 10 minutes with the patient via telehealth technology discussing the above problems/concerns.    Apolonio Schneiders, FNP

## 2022-03-19 ENCOUNTER — Encounter: Payer: Self-pay | Admitting: Nurse Practitioner

## 2022-05-05 ENCOUNTER — Other Ambulatory Visit: Payer: Self-pay | Admitting: Family Medicine

## 2022-05-06 NOTE — Telephone Encounter (Signed)
Message from pharmacy:  Alternative Requested:HELLO THIS IS NOT COVERED PLEASE SEND ALTERNATIVE OR CONTACT THE INSURANCE FOR PRIOR AUTH.  Suggested alternative: levalbuterol (Xopenex HFA) 45 MCG/ACT inhaler.  Last OV:  11/05/21, CPE Next OV:  11/07/22, CPE

## 2022-05-07 NOTE — Telephone Encounter (Signed)
Sent in generic albuterol inh with request to formulate based on affordability/insurance coverage

## 2022-05-18 ENCOUNTER — Other Ambulatory Visit: Payer: Self-pay | Admitting: Family Medicine

## 2022-05-20 NOTE — Telephone Encounter (Signed)
ERx 

## 2022-05-20 NOTE — Telephone Encounter (Signed)
Message from pharmacy:  Alternative Requested:LEVALBUTEROL PREFERRED.   Last OV:  11/05/21, CPE Next OV:  11/07/22, CPE

## 2022-06-16 ENCOUNTER — Ambulatory Visit: Payer: BC Managed Care – PPO | Admitting: Psychiatry

## 2022-06-16 ENCOUNTER — Encounter: Payer: Self-pay | Admitting: Psychiatry

## 2022-06-16 VITALS — BP 125/82 | HR 87 | Temp 97.1°F | Ht 70.0 in | Wt 153.4 lb

## 2022-06-16 DIAGNOSIS — F411 Generalized anxiety disorder: Secondary | ICD-10-CM

## 2022-06-16 DIAGNOSIS — Z79899 Other long term (current) drug therapy: Secondary | ICD-10-CM

## 2022-06-16 DIAGNOSIS — Z634 Disappearance and death of family member: Secondary | ICD-10-CM

## 2022-06-16 DIAGNOSIS — F3178 Bipolar disorder, in full remission, most recent episode mixed: Secondary | ICD-10-CM | POA: Diagnosis not present

## 2022-06-16 MED ORDER — HYDROXYZINE HCL 25 MG PO TABS: 12.5000 mg | ORAL_TABLET | Freq: Two times a day (BID) | ORAL | 1 refills | Status: DC | PRN

## 2022-06-16 MED ORDER — AMITRIPTYLINE HCL 10 MG PO TABS: 30.0000 mg | ORAL_TABLET | Freq: Every day | ORAL | 0 refills | Status: DC

## 2022-06-16 NOTE — Progress Notes (Unsigned)
BH MD OP Progress Note  06/16/2022 9:12 AM [name] [name]  MRN:  [id number]  Chief Complaint:  Chief Complaint  Patient presents with   Follow-up   Anxiety   grief   Medication Refill   HPI: [name] [name] is a 27 year old Caucasian male, employed, lives in [place], engaged, has a history of bipolar disorder type II, GAD, bereavement, asthma was evaluated in office today.  Patient today reports she is currently having a lot of anxiety symptoms.  He has been worrying about his parents who continues to grieve the loss of his brother who passed away a year ago.  Patient reports it was his death anniversary last month.  He is currently coping better with his grief.  Continues to work with this therapist on it.  Patient also reports sleep problems.  He does use sleepy time tea as well as melatonin on and off.  That does help him to fall asleep however he continues to have interrupted sleep.  Patient does report having concentration problems during the day.  Patient is compliant on his Depakote, amitriptyline.  Denies side effects.  He tried a higher dosage of Depakote however he did not tolerate that well and his level went up significantly high even with a small increase of the Depakote dosage.  Agreeable to dosage increase of amitriptyline to target his anxiety as well as sleep.  Also agreeable to trial of hydroxyzine as needed.  Patient denies any suicidality, homicidality or perceptual disturbances.  Patient denies any other concerns today.  Visit Diagnosis:    ICD-10-CM   1. Bipolar disorder, in full remission, most recent episode mixed (HCC)  F31.78 hydrOXYzine (ATARAX) 25 MG tablet    Valproic acid level    Sodium    Hepatic function panel    Platelet count   Type II    2. GAD (generalized anxiety disorder)  F41.1 amitriptyline (ELAVIL) 10 MG tablet    hydrOXYzine (ATARAX) 25 MG tablet    3. Bereavement  Z63.4 hydrOXYzine (ATARAX) 25 MG tablet    4. High risk medication use   Z79.899 Valproic acid level    Sodium    Hepatic function panel    Platelet count      Past Psychiatric History: I have reviewed past psychiatric history from progress note on 10/09/2020.  Past trials of medications like sertraline, Prozac, BuSpar, bupropion, Celexa, Abilify.  Neuropsychological testing completed May 21, 2020-diagnosis of bipolar disorder type II, GAD.  Patient used to be under the care of therapist-Mr. Delight Ovens.  Past Medical History:  Past Medical History:  Diagnosis Date   Asthma    Deformity, chest wall, congenital    Environmental allergies    mold,pine trees   History of meningitis infancy   OSA (obstructive sleep apnea)    Seasonal allergies     Past Surgical History:  Procedure Laterality Date   TONSILLECTOMY AND ADENOIDECTOMY  2001   ?growing back    Family Psychiatric History: I have reviewed family psychiatric history from progress note on 10/09/2020.  Family History:  Family History  Problem Relation Age of Onset   Anxiety disorder Mother    Depression Mother    Hypertension Mother    Arthritis Mother        osteo   Depression Father    Anxiety disorder Father    Hypertension Father    Asthma Brother    Cancer Maternal Aunt        ovarian   Arthritis/Rheumatoid Maternal Aunt  Cancer Maternal Uncle        lung   Diabetes Maternal Uncle    Hyperlipidemia Maternal Uncle    Diabetes Paternal Uncle    Cancer Maternal Grandfather        lung   Heart disease Maternal Grandfather    Cancer Maternal Grandmother        uterine   Hyperlipidemia Maternal Grandmother    Cancer Paternal Grandfather        penile   Suicidality Paternal Grandmother    Diabetes Mellitus I Cousin        juvenile   ADD / ADHD Half-Brother    CAD Neg Hx    Stroke Neg Hx     Social History: I have reviewed social history from progress note on 10/09/2020. Social History   Socioeconomic History   Marital status: Single    Spouse name: Not on file   Number  of children: Not on file   Years of education: Not on file   Highest education level: Not on file  Occupational History   Occupation: student  Tobacco Use   Smoking status: Never   Smokeless tobacco: Never  Vaping Use   Vaping Use: Never used  Substance and Sexual Activity   Alcohol use: No   Drug use: No   Sexual activity: Not on file  Other Topics Concern   Not on file  Social History Narrative   Lives in [place]    Older brother is paramedic in [place]   [employer] -> [employer] -> [employer] grad school for [employer]   A/Bs.    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[place], works remotely as Chief Financial Officer   Activity: walking regularly at work   Diet: good water, fruits, vegetables    Social Determinants of Corporate investment banker Strain: Not on [employer] Insecurity: Not on file  Transportation Needs: Not on file  Physical Activity: Not on file  Stress: Not on file  Social Connections: Not on file    Allergies:  Allergies  Allergen Reactions   Peanut-Containing Drug Products    Singulair [Montelukast] Other (See Comments)    Worsened agitation/irritability   Wellbutrin [Bupropion] Other (See Comments)    Severe headache affecting vision    Metabolic Disorder Labs: No results found for: "HGBA1C", "MPG" No results found for: "PROLACTIN" Lab Results  Component Value Date   CHOL 120 10/07/2019   TRIG 49 10/07/2019   HDL 53 10/07/2019   CHOLHDL 2.3 10/07/2019   LDLCALC 53 10/07/2019   Lab Results  Component Value Date   TSH 3.85 08/01/2020   TSH 1.75 10/07/2019    Therapeutic Level Labs: No results found for: "LITHIUM" Lab Results  Component Value Date   VALPROATE 27 (L) 07/09/2021   VALPROATE 38 (L) 11/21/2020   No results found for: "CBMZ"  Current Medications: Current Outpatient Medications  Medication Sig Dispense Refill   cetirizine (ZYRTEC) 10 MG tablet Take 10 mg by mouth daily as needed.      Cholecalciferol (VITAMIN D3)  25 MCG (1000 UT) CAPS Take 1 capsule (1,000 Units total) by mouth daily. 30 capsule    divalproex (DEPAKOTE) 125 MG DR tablet Take 1 tablet (125 mg total) by mouth 2 (two) times daily. 180 tablet 1   fluticasone-salmeterol (WIXELA INHUB) 250-50 MCG/ACT AEPB Inhale 1 puff into the lungs in the morning and at bedtime. 60 each 5   hydrOXYzine (ATARAX) 25 MG tablet Take 0.5-1 tablets (  12.5-25 mg total) by mouth 2 (two) times daily as needed for anxiety (sleep). 60 tablet 1   levalbuterol (XOPENEX HFA) 45 MCG/ACT inhaler Inhale 2 puffs into the lungs every 6 (six) hours as needed for wheezing. 15 g 3   ondansetron (ZOFRAN-ODT) 4 MG disintegrating tablet Take 1 tablet (4 mg total) by mouth every 8 (eight) hours as needed for nausea or vomiting. 20 tablet 0   vitamin B-12 (CYANOCOBALAMIN) 500 MCG tablet Take 1 tablet (500 mcg total) by mouth daily.     amitriptyline (ELAVIL) 10 MG tablet Take 3 tablets (30 mg total) by mouth at bedtime. 270 tablet 0   No current facility-administered medications for this visit.     Musculoskeletal: Strength & Muscle Tone: within normal limits Gait & Station: normal Patient leans: N/A  Psychiatric Specialty Exam: Review of Systems  Psychiatric/Behavioral:  Positive for decreased concentration and sleep disturbance. The patient is nervous/anxious.     Blood pressure 125/82, pulse 87, temperature (!) 97.1 F (36.2 C), temperature source Skin, height 5\' 10"  (1.778 m), weight 153 lb 6.4 oz (69.6 kg).Body mass index is 22.01 kg/m.  General Appearance: Casual  Eye Contact:  Fair  Speech:  Normal Rate  Volume:  Normal  Mood:  Anxious  Affect:  Appropriate  Thought Process:  Goal Directed and Descriptions of Associations: Intact  Orientation:  Full (Time, Place, and Person)  Thought Content: Logical   Suicidal Thoughts:  No  Homicidal Thoughts:  No  Memory:  Immediate;   Fair Recent;   Fair Remote;   Fair  Judgement:  Fair  Insight:  Fair  Psychomotor  Activity:  Normal  Concentration:  Concentration: Fair and Attention Span: Fair  Recall:  [employer] of Knowledge: Fair  Language: Fair  Akathisia:  No  Handed:  Right  AIMS (if indicated): not done  Assets:  Communication Skills Desire for Improvement Housing Social Support  ADL's:  Intact  Cognition: WNL  Sleep:  Poor   Screenings: AIMS    Flowsheet Row Video Visit from 07/11/2021 in [facility] Office Visit from 06/11/2021 in [facility]  AIMS Total Score 0 0      GAD-7    Flowsheet Row Office Visit from 06/16/2022 in [facility] Video Visit from 01/14/2022 in [facility] Office Visit from 11/05/2021 in [facility] Video Visit from 10/15/2021 in [facility] Video Visit from 03/13/2021 in [facility]  Total GAD-7 Score 12 9 8  0 3      PHQ2-9    Flowsheet Row Office Visit from 06/16/2022 in [facility] Video Visit from 01/14/2022 in [facility] Office Visit from 11/05/2021 in [facility] Video Visit from 10/15/2021 in [facility] Video Visit from 07/11/2021 in [facility]  PHQ-2 Total Score 1 0 2 0 1  PHQ-9 Total Score 6 -- 10 -- 1      Flowsheet Row Office Visit from 06/16/2022 in [facility] Video Visit from 01/14/2022 in [facility] Video Visit from 10/15/2021 in [facility]  C-SSRS RISK CATEGORY No Risk No Risk No Risk        Assessment and Plan: [name] [name]  is a 27 year old Caucasian male,  employed, lives with his fiance, lives in [place], has a history of bipolar disorder type II, GAD, currently struggling with anxiety, sleep problems, will benefit from the following plan.  Plan Bipolar disorder type II mixed in remission Depakote 125 mg p.o. twice daily-reduced dosage. Depakote level-07/09/2021--27-subtherapeutic.  However previous dosage increase did not work, even with a small dosage increase of Depakote his levels spiked.  GAD-unstable Continue CBT with Ms. Constance Increase amitriptyline to 30 mg p.o. nightly Start hydroxyzine 12.5-25 mg twice a day as needed for anxiety and sleep  Bereavement-improving Continue CBT  High risk medication use-will order LFT, platelet count, sodium level, Depakote level.  Patient to go to [facility]  Provided printed out lab slip.  Follow-up in clinic in 4 to 5 weeks or sooner if needed.   Collaboration of Care: Collaboration of Care: Referral or follow-up with counselor/therapist AEB patient to continue CBT  Patient/Guardian was advised Release of Information must be obtained prior to any record release in order to collaborate their care with an outside provider. Patient/Guardian was advised if they have not already done so to contact the registration department to sign all necessary forms in order for [place] to release information regarding their care.   Consent: Patient/Guardian gives verbal consent for treatment and assignment of benefits for services provided during this visit. Patient/Guardian expressed understanding and agreed to proceed.   This note was generated in part or whole with voice recognition software. Voice recognition is usually quite accurate but there are transcription errors that can and very often do occur. I apologize for any typographical errors that were not detected and corrected.    Jomarie Longs, MD 06/17/2022, 8:31 AM

## 2022-06-16 NOTE — Patient Instructions (Signed)
Hydroxyzine Capsules or Tablets What is this medication? HYDROXYZINE (hye DROX i zeen) treats the symptoms of allergies and allergic reactions. It may also be used to treat anxiety or cause drowsiness before a procedure. It works by blocking histamine, a substance released by the body during an allergic reaction. It belongs to a group of medications called antihistamines. This medicine may be used for other purposes; ask your health care provider or pharmacist if you have questions. COMMON BRAND NAME(S): ANX, Atarax, Rezine, Vistaril What should I tell my care team before I take this medication? They need to know if you have any of these conditions: Glaucoma Heart disease Irregular heartbeat or rhythm Kidney disease Liver disease Lung or breathing disease, such as asthma Stomach or intestine problems Thyroid disease Trouble passing urine An unusual or allergic reaction to hydroxyzine, other medications, foods, dyes or preservatives Pregnant or trying to get pregnant Breastfeeding How should I use this medication? Take this medication by mouth with a full glass of water. Take it as directed on the prescription label at the same time every day. You can take it with or without food. If it upsets your stomach, take it with food. Talk to your care team about the use of this medication in children. While it may be prescribed for children as young as 6 years for selected conditions, precautions do apply. People 65 years and older may have a stronger reaction and need a smaller dose. Overdosage: If you think you have taken too much of this medicine contact a poison control center or emergency room at once. NOTE: This medicine is only for you. Do not share this medicine with others. What if I miss a dose? If you miss a dose, take it as soon as you can. If it is almost time for your next dose, take only that dose. Do not take double or extra doses. What may interact with this medication? Do not  take this medication with any of the following: Cisapride Dronedarone Pimozide Thioridazine This medication may also interact with the following: Alcohol Antihistamines for allergy, cough, and cold Atropine Barbiturate medications for sleep or seizures, such as phenobarbital Certain antibiotics, such as erythromycin or clarithromycin Certain medications for anxiety or sleep Certain medications for bladder problems, such as oxybutynin or tolterodine Certain medications for irregular heartbeat Certain medications for mental health conditions Certain medications for Parkinson disease, such as benztropine, trihexyphenidyl Certain medications for seizures, such as phenobarbital or primidone Certain medications for stomach problems, such as dicyclomine or hyoscyamine Certain medications for travel sickness, such as scopolamine Ipratropium Opioid medications for pain Other medications that cause heart rhythm changes, such as dofetilide This list may not describe all possible interactions. Give your health care provider a list of all the medicines, herbs, non-prescription drugs, or dietary supplements you use. Also tell them if you smoke, drink alcohol, or use illegal drugs. Some items may interact with your medicine. What should I watch for while using this medication? Visit your care team for regular checks on your progress. Tell your care team if your symptoms do not start to get better or if they get worse. This medication may affect your coordination, reaction time, or judgment. Do not drive or operate machinery until you know how this medication affects you. Sit up or stand slowly to reduce the risk of dizzy or fainting spells. Drinking alcohol with this medication can increase the risk of these side effects. Your mouth may get dry. Chewing sugarless gum or sucking hard candy   and drinking plenty of water may help. Contact your care team if the problem does not go away or is severe. This  medication may cause dry eyes and blurred vision. If you wear contact lenses, you may feel some discomfort. Lubricating eye drops may help. See your care team if the problem does not go away or is severe. If you are receiving skin tests for allergies, tell your care team you are taking this medication. What side effects may I notice from receiving this medication? Side effects that you should report to your care team as soon as possible: Allergic reactions--skin rash, itching, hives, swelling of the face, lips, tongue, or throat Heart rhythm changes--fast or irregular heartbeat, dizziness, feeling faint or lightheaded, chest pain, trouble breathing Side effects that usually do not require medical attention (report to your care team if they continue or are bothersome): Confusion Drowsiness Dry mouth Hallucinations Headache This list may not describe all possible side effects. Call your doctor for medical advice about side effects. You may report side effects to FDA at 1-800-FDA-1088. Where should I keep my medication? Keep out of the reach of children and pets. Store at room temperature between 15 and 30 degrees C (59 and 86 degrees F). Keep container tightly closed. Throw away any unused medication after the expiration date. NOTE: This sheet is a summary. It may not cover all possible information. If you have questions about this medicine, talk to your doctor, pharmacist, or health care provider.  2024 Elsevier/Gold Standard (2021-08-09 00:00:00)  

## 2022-06-23 ENCOUNTER — Other Ambulatory Visit: Payer: Self-pay | Admitting: Psychiatry

## 2022-06-23 DIAGNOSIS — F411 Generalized anxiety disorder: Secondary | ICD-10-CM

## 2022-07-08 ENCOUNTER — Other Ambulatory Visit: Payer: Self-pay | Admitting: Psychiatry

## 2022-07-08 DIAGNOSIS — F3178 Bipolar disorder, in full remission, most recent episode mixed: Secondary | ICD-10-CM

## 2022-07-08 DIAGNOSIS — Z634 Disappearance and death of family member: Secondary | ICD-10-CM

## 2022-07-08 DIAGNOSIS — F411 Generalized anxiety disorder: Secondary | ICD-10-CM

## 2022-07-21 ENCOUNTER — Telehealth: Payer: Self-pay | Admitting: Psychiatry

## 2022-07-21 NOTE — Telephone Encounter (Signed)
6/13 & 7/8 messages left on answering machine to reschedule appointment & mychart message sent

## 2022-08-01 ENCOUNTER — Encounter: Payer: Self-pay | Admitting: Family Medicine

## 2022-08-01 ENCOUNTER — Telehealth: Payer: BC Managed Care – PPO | Admitting: Psychiatry

## 2022-08-01 MED ORDER — ALBUTEROL SULFATE HFA 108 (90 BASE) MCG/ACT IN AERS: 2.0000 | INHALATION_SPRAY | Freq: Four times a day (QID) | RESPIRATORY_TRACT | 3 refills | Status: AC | PRN

## 2022-08-01 NOTE — Addendum Note (Signed)
Addended by: Eustaquio Boyden on: 08/01/2022 05:28 PM   Modules accepted: Orders

## 2022-09-10 ENCOUNTER — Other Ambulatory Visit: Payer: Self-pay | Admitting: Psychiatry

## 2022-09-10 DIAGNOSIS — F411 Generalized anxiety disorder: Secondary | ICD-10-CM

## 2022-10-19 ENCOUNTER — Other Ambulatory Visit: Payer: Self-pay | Admitting: Psychiatry

## 2022-10-19 DIAGNOSIS — F411 Generalized anxiety disorder: Secondary | ICD-10-CM

## 2022-10-20 ENCOUNTER — Telehealth: Payer: Self-pay | Admitting: Psychiatry

## 2022-10-20 DIAGNOSIS — F411 Generalized anxiety disorder: Secondary | ICD-10-CM

## 2022-10-20 MED ORDER — AMITRIPTYLINE HCL 10 MG PO TABS
30.0000 mg | ORAL_TABLET | Freq: Every day | ORAL | 0 refills | Status: AC
Start: 2022-10-20 — End: ?

## 2022-10-20 NOTE — Telephone Encounter (Signed)
Please contact patient to schedule an appointment since they will not see one schedule.  For now we will send a 30-day supply of medications, no future refills without appointment.

## 2022-10-22 NOTE — Telephone Encounter (Signed)
Noted  

## 2022-10-30 ENCOUNTER — Other Ambulatory Visit: Payer: Self-pay | Admitting: Family Medicine

## 2022-10-30 DIAGNOSIS — R7989 Other specified abnormal findings of blood chemistry: Secondary | ICD-10-CM

## 2022-10-30 DIAGNOSIS — E538 Deficiency of other specified B group vitamins: Secondary | ICD-10-CM | POA: Insufficient documentation

## 2022-10-30 DIAGNOSIS — Z1159 Encounter for screening for other viral diseases: Secondary | ICD-10-CM

## 2022-10-30 DIAGNOSIS — Z79899 Other long term (current) drug therapy: Secondary | ICD-10-CM

## 2022-10-30 DIAGNOSIS — F3181 Bipolar II disorder: Secondary | ICD-10-CM

## 2022-10-31 ENCOUNTER — Other Ambulatory Visit: Payer: BC Managed Care – PPO

## 2022-11-07 ENCOUNTER — Encounter: Payer: BC Managed Care – PPO | Admitting: Family Medicine

## 2022-11-20 ENCOUNTER — Other Ambulatory Visit: Payer: Self-pay | Admitting: Psychiatry

## 2022-11-20 DIAGNOSIS — F411 Generalized anxiety disorder: Secondary | ICD-10-CM

## 2023-05-07 ENCOUNTER — Ambulatory Visit
Admission: EM | Admit: 2023-05-07 | Discharge: 2023-05-07 | Disposition: A | Discharge status: Home / Self Care | Attending: Family Medicine | Admitting: Family Medicine

## 2023-05-07 DIAGNOSIS — H60393 Other infective otitis externa, bilateral: Secondary | ICD-10-CM | POA: Diagnosis not present

## 2023-05-07 MED ORDER — OFLOXACIN 0.3 % OT SOLN: 5.0000 [drp] | Freq: Two times a day (BID) | OTIC | 0 refills | Status: AC

## 2023-05-07 NOTE — ED Provider Notes (Signed)
 [facility]    CSN: [id number] Arrival date & time: 05/07/23  1719      History   Chief Complaint Chief Complaint  Patient presents with   Ear Pain    HPI [name] [name] is a 28 y.o. male.   HPI Here for bilateral ear pain and itching.  His ears have felt full also.  Symptoms began about a week ago and started with his left ear.  Then the right ear started bothering him.  No fever or chills no cough or congestion. Past Medical History:  Diagnosis Date   Asthma    Deformity, chest wall, congenital    Environmental allergies    mold,pine trees   History of meningitis infancy   OSA (obstructive sleep apnea)    Seasonal allergies     Patient Active Problem List   Diagnosis Date Noted   Low serum vitamin B12 10/30/2022   Bereavement 06/11/2021   Bipolar disorder, in full remission, most recent episode mixed (HCC) 03/13/2021   Bipolar 2 disorder (HCC) 10/09/2020   High risk medication use 10/09/2020   Mood disorder (HCC) 02/07/2020   Insomnia 02/07/2020   Restlessness 10/08/2019   GAD (generalized anxiety disorder) 06/26/2015   Encounter for general adult medical examination with abnormal findings 08/20/2012   Pectus carinatum    Seasonal and perennial allergic rhinoconjunctivitis of both eyes 06/18/2012   Obstructive sleep apnea 04/19/2012   Moderate persistent asthma 04/19/2012   Allergic rhinitis 04/19/2012    Past Surgical History:  Procedure Laterality Date   TONSILLECTOMY AND ADENOIDECTOMY  2001   ?growing back       Home Medications    Prior to Admission medications   Medication Sig Start Date End Date Taking? Authorizing Provider  ofloxacin  (FLOXIN ) 0.3 % OTIC solution Place 5 drops into both ears 2 (two) times daily for 7 days. 05/07/23 05/14/23 Yes Ann Keto, MD  albuterol  (VENTOLIN  HFA) 108 (90 Base) MCG/ACT inhaler Inhale 2 puffs into the lungs every 6 (six) hours as needed for wheezing or shortness of breath. 08/01/22   Claire Crick, MD  amitriptyline  (ELAVIL ) 10 MG tablet Take 3 tablets (30 mg total) by mouth at bedtime. 10/20/22   Eappen, Saramma, MD  cetirizine (ZYRTEC) 10 MG tablet Take 10 mg by mouth daily as needed.     [provider]  Cholecalciferol (VITAMIN D3) 25 MCG (1000 UT) CAPS Take 1 capsule (1,000 Units total) by mouth daily. 10/10/20   Claire Crick, MD  divalproex  (DEPAKOTE ) 125 MG DR tablet Take 1 tablet (125 mg total) by mouth 2 (two) times daily. 01/14/22   Eappen, Saramma, MD  fluticasone -salmeterol (WIXELA INHUB) 250-50 MCG/ACT AEPB Inhale 1 puff into the lungs in the morning and at bedtime. 01/21/22   Claire Crick, MD  hydrOXYzine  (ATARAX ) 25 MG tablet TAKE 0.5-1 TABLETS (12.5-25 MG TOTAL) BY MOUTH 2 (TWO) TIMES DAILY AS NEEDED FOR ANXIETY (SLEEP). 07/08/22   Umrania, Hiren M, MD  vitamin B-12 (CYANOCOBALAMIN ) 500 MCG tablet Take 1 tablet (500 mcg total) by mouth daily. 10/10/20   Claire Crick, MD    Family History Family History  Problem Relation Age of Onset   Anxiety disorder Mother    Depression Mother    Hypertension Mother    Arthritis Mother        osteo   Depression Father    Anxiety disorder Father    Hypertension Father    Asthma Brother    Cancer Maternal Aunt  ovarian   Arthritis/Rheumatoid Maternal Aunt    Cancer Maternal Uncle        lung   Diabetes Maternal Uncle    Hyperlipidemia Maternal Uncle    Diabetes Paternal Uncle    Cancer Maternal Grandfather        lung   Heart disease Maternal Grandfather    Cancer Maternal Grandmother        uterine   Hyperlipidemia Maternal Grandmother    Cancer Paternal Grandfather        penile   Suicidality Paternal Grandmother    Diabetes Mellitus I Cousin        juvenile   ADD / ADHD Half-Brother    CAD Neg Hx    Stroke Neg Hx     Social History Social History   Tobacco Use   Smoking status: Never   Smokeless tobacco: Never  Vaping Use   Vaping status: Never Used  Substance Use Topics    Alcohol use: No   Drug use: No     Allergies   Peanut-containing drug products and Singulair  [montelukast ]   Review of Systems Review of Systems   Physical Exam Triage Vital Signs ED Triage Vitals [05/07/23 1735]  Encounter Vitals Group     BP 128/84     Systolic BP Percentile      Diastolic BP Percentile      Pulse Rate 69     Resp 18     Temp 98 F (36.7 C)     Temp Source Oral     SpO2 97 %     Weight      Height      Head Circumference      Peak Flow      Pain Score 4     Pain Loc      Pain Education      Exclude from Growth Chart    No data found.  Updated Vital Signs BP 128/84 (BP Location: Right Arm)   Pulse 69   Temp 98 F (36.7 C) (Oral)   Resp 18   SpO2 97%   Visual Acuity Right Eye Distance:   Left Eye Distance:   Bilateral Distance:    Right Eye Near:   Left Eye Near:    Bilateral Near:     Physical Exam Vitals reviewed.  Constitutional:      General: He is not in acute distress.    Appearance: He is not ill-appearing, toxic-appearing or diaphoretic.  HENT:     Ears:     Comments: Bilaterally the tympanic membranes are gray and shiny.  The canals both have some white adherent discharge with some mild erythema and a little bit of swelling.  There is just a tiny amount of cerumen in the left ear canal.  No pain on traction of the pinnae and no swelling around the pinnae. Skin:    Coloration: Skin is not jaundiced or pale.  Neurological:     Mental Status: He is alert and oriented to person, place, and time.  Psychiatric:        Behavior: Behavior normal.      UC Treatments / Results  Labs (all labs ordered are listed, but only abnormal results are displayed) Labs Reviewed - No data to display  EKG   Radiology No results found.  Procedures Procedures (including critical care time)  Medications Ordered in UC Medications - No data to display  Initial Impression / Assessment and Plan / UC Course  I  have reviewed the  triage vital signs and the nursing notes.  Pertinent labs & imaging results that were available during my care of the patient were reviewed by me and considered in my medical decision making (see chart for details).     Floxin  is sent in for the otitis externa he will take Advil or Tylenol as needed for the pain. Final Clinical Impressions(s) / UC Diagnoses   Final diagnoses:  Infective otitis externa of both ears     Discharge Instructions      -Floxin  eardrops-5 drops in the affected ear 2 times daily for 7 days.  Take Tylenol or ibuprofen as needed for your pain     ED Prescriptions     Medication Sig Dispense Auth. Provider   ofloxacin  (FLOXIN ) 0.3 % OTIC solution Place 5 drops into both ears 2 (two) times daily for 7 days. 5 mL Ann Keto, MD      PDMP not reviewed this encounter.   Ann Keto, MD 05/07/23 [id number]

## 2023-05-07 NOTE — Discharge Instructions (Addendum)
-  Floxin  eardrops-5 drops in the affected ear 2 times daily for 7 days.  Take Tylenol or ibuprofen as needed for your pain

## 2023-05-07 NOTE — ED Triage Notes (Signed)
 [facility] present at [facility] with c/o bilateral ear pain, itching and fullnes x one week. Pt states it started with the lt ear, states he felt his "skin split". Pt states he has had trouble hearing.
